# Patient Record
Sex: Female | Born: 1960 | Race: Asian | Hispanic: No | Marital: Married | Smoking: Never smoker
Health system: Southern US, Community
[De-identification: ages and names within clinical notes are randomized; demographics above are authoritative.]

## PROBLEM LIST (undated history)

## (undated) DIAGNOSIS — I10 Essential (primary) hypertension: Secondary | ICD-10-CM

## (undated) DIAGNOSIS — F419 Anxiety disorder, unspecified: Secondary | ICD-10-CM

## (undated) DIAGNOSIS — H7290 Unspecified perforation of tympanic membrane, unspecified ear: Secondary | ICD-10-CM

## (undated) HISTORY — DX: Unspecified perforation of tympanic membrane, unspecified ear: H72.90

## (undated) HISTORY — DX: Anxiety disorder, unspecified: F41.9

## (undated) HISTORY — DX: Essential (primary) hypertension: I10

---

## 2016-11-17 HISTORY — PX: TYMPANOPLASTY: SHX33

## 2017-01-21 ENCOUNTER — Ambulatory Visit (INDEPENDENT_AMBULATORY_CARE_PROVIDER_SITE_OTHER): Payer: BLUE CROSS/BLUE SHIELD | Admitting: Obstetrics and Gynecology

## 2017-01-21 ENCOUNTER — Encounter: Payer: Self-pay | Admitting: Obstetrics and Gynecology

## 2017-01-21 VITALS — BP 132/80 | HR 84 | Resp 14 | Ht 62.5 in | Wt 114.0 lb

## 2017-01-21 DIAGNOSIS — Z0000 Encounter for general adult medical examination without abnormal findings: Secondary | ICD-10-CM | POA: Diagnosis not present

## 2017-01-21 DIAGNOSIS — Z124 Encounter for screening for malignant neoplasm of cervix: Secondary | ICD-10-CM

## 2017-01-21 DIAGNOSIS — N952 Postmenopausal atrophic vaginitis: Secondary | ICD-10-CM

## 2017-01-21 DIAGNOSIS — N941 Unspecified dyspareunia: Secondary | ICD-10-CM

## 2017-01-21 DIAGNOSIS — Z01419 Encounter for gynecological examination (general) (routine) without abnormal findings: Secondary | ICD-10-CM | POA: Diagnosis not present

## 2017-01-21 LAB — LIPID PANEL
Cholesterol: 227 mg/dL — ABNORMAL HIGH (ref <200)
HDL: 72 mg/dL (ref >50)
LDL Cholesterol: 129 mg/dL — ABNORMAL HIGH (ref <100)
Total CHOL/HDL Ratio: 3.2 Ratio (ref <5.0)
Triglycerides: 132 mg/dL (ref <150)
VLDL: 26 mg/dL (ref <30)

## 2017-01-21 LAB — COMPREHENSIVE METABOLIC PANEL
ALBUMIN: 4.8 g/dL (ref 3.6–5.1)
ALK PHOS: 66 U/L (ref 33–130)
ALT: 8 U/L (ref 6–29)
AST: 18 U/L (ref 10–35)
BILIRUBIN TOTAL: 0.8 mg/dL (ref 0.2–1.2)
BUN: 19 mg/dL (ref 7–25)
CALCIUM: 9.8 mg/dL (ref 8.6–10.4)
CO2: 27 mmol/L (ref 20–31)
Chloride: 102 mmol/L (ref 98–110)
Creat: 0.75 mg/dL (ref 0.50–1.05)
Glucose, Bld: 104 mg/dL — ABNORMAL HIGH (ref 65–99)
POTASSIUM: 5.2 mmol/L (ref 3.5–5.3)
Sodium: 141 mmol/L (ref 135–146)
Total Protein: 8.1 g/dL (ref 6.1–8.1)

## 2017-01-21 LAB — CBC
HEMATOCRIT: 42.7 % (ref 35.0–45.0)
HEMOGLOBIN: 14.1 g/dL (ref 11.7–15.5)
MCH: 29.8 pg (ref 27.0–33.0)
MCHC: 33 g/dL (ref 32.0–36.0)
MCV: 90.3 fL (ref 80.0–100.0)
MPV: 9.1 fL (ref 7.5–12.5)
Platelets: 278 10*3/uL (ref 140–400)
RBC: 4.73 MIL/uL (ref 3.80–5.10)
RDW: 13.1 % (ref 11.0–15.0)
WBC: 5.4 10*3/uL (ref 3.8–10.8)

## 2017-01-21 MED ORDER — ESTRADIOL 10 MCG VA TABS: ORAL_TABLET | VAGINAL | 0 refills | Status: DC

## 2017-01-21 NOTE — Patient Instructions (Signed)

## 2017-01-21 NOTE — Progress Notes (Addendum)
56 y.o. [id number] MarriedAsianF here for annual exam.   She gets intermittently getting irritated in between her buttocks. She uses steroid cream as needed and it improves. A month ago she noticed some bumps just outside of her vagina. No pain, no irritation. No vaginal bleeding. Not sexually active secondary to pain. Too uncomfortable for even a finger.  Her husband has had a MI and is on medication for that and depression. He has had trouble having intercourse as well. She would like to improve things. She has tried lubricant in the past without help.  No vasomotor symptoms.     No LMP recorded. Patient is postmenopausal.          Sexually active: No.  The current method of family planning is post menopausal status.    Exercising: No.  The patient does not participate in regular exercise at present. Smoker:  no  Health Maintenance: Pap:  2014 WNL per patient  History of abnormal Pap:  no MMG:  2012 WNL per patient  Colonoscopy:  Never BMD:   2013, normal. TDaP:  2013 Gardasil: N/A   reports that she has never smoked. She has never used smokeless tobacco. She reports that she drinks about 8.4 oz of alcohol per week . She reports that she does not use drugs.She just quit her job. She and her husband moved here from [place] last year. Error above, only 1-2 drinks a week   Past Medical History:  Diagnosis Date  . Anxiety   . Hypertension   Thinks her HTN was due to stress with her husbands MI.   No past surgical history on file.  Current Outpatient Prescriptions  Medication Sig Dispense Refill  . aspirin EC 81 MG tablet Take 81 mg by mouth daily.    . cholecalciferol (VITAMIN D) 1000 units tablet Take 1,000 Units by mouth daily.    . Cyanocobalamin (VITAMIN B 12 PO) Take 1,000 mcg by mouth.    . Omega 3 1200 MG CAPS Take by mouth.    . vitamin C (ASCORBIC ACID) 500 MG tablet Take 500 mg by mouth daily.     No current facility-administered medications for this visit.     Family  History  Problem Relation Age of Onset  . Stroke Father   . Hypertension Father   . Alzheimer's disease Mother     Review of Systems  Constitutional: Negative.   HENT: Negative.   Eyes: Negative.   Respiratory: Negative.   Cardiovascular: Negative.   Gastrointestinal: Negative.   Endocrine: Negative.   Genitourinary: Positive for dyspareunia.       Vaginal rash  Musculoskeletal: Negative.   Skin: Negative.   Allergic/Immunologic: Negative.   Neurological: Negative.   Psychiatric/Behavioral: Negative.     Exam:   BP 132/80 (BP Location: Right Arm, Patient Position: Sitting, Cuff Size: Normal)   Pulse 84   Resp 14   Ht 5' 2.5" (1.588 m)   Wt 114 lb (51.7 kg)   BMI 20.52 kg/m   Weight change: @WEIGHTCHANGE @ Height:   Height: 5' 2.5" (158.8 cm)  Ht Readings from Last 3 Encounters:  01/21/17 5' 2.5" (1.588 m)    General appearance: alert, cooperative and appears stated age Head: Normocephalic, without obvious abnormality, atraumatic Neck: no adenopathy, supple, symmetrical, trachea midline and thyroid normal to inspection and palpation Lungs: clear to auscultation bilaterally Cardiovascular: regular rate and rhythm Breasts: normal appearance, no masses or tenderness Heart: regular rate and rhythm Abdomen: soft, non-tender; bowel sounds normal; no  masses,  no organomegaly Extremities: extremities normal, atraumatic, no cyanosis or edema Skin: Skin color, texture, turgor normal. No rashes or lesions Lymph nodes: Cervical, supraclavicular, and axillary nodes normal. No abnormal inguinal nodes palpated Neurologic: Grossly normal   Pelvic: External genitalia:  no lesions, few small sebaceous cysts              Urethra:  normal appearing urethra with no masses, tenderness or lesions              Bartholins and Skenes: normal                 Vagina: very atrophic vagina, tight with insertion of one finger vaginally              Cervix: no lesions               Bimanual  Exam:  Uterus:  normal size, contour, position, consistency, mobility, non-tender              Adnexa: no mass, fullness, tenderness               Rectovaginal: Confirms               Anus:  normal sphincter tone, no lesions  Chaperone was present for exam.  A:  Well Woman with normal exam  Vaginal atrophy  Dyspareunia  P:   Pap with hpv  Colonoscopy, will set up  Mammogram  Screening labs  Discussed breast self exam  Discussed calcium and vit D intake  Start vaginal estrogen  F/u in 1 month, don't have intercourse prior to f/u appointment

## 2017-01-22 LAB — VITAMIN D 25 HYDROXY (VIT D DEFICIENCY, FRACTURES): Vit D, 25-Hydroxy: 38 ng/mL (ref 30–100)

## 2017-01-23 LAB — IPS PAP TEST WITH HPV

## 2017-02-18 ENCOUNTER — Ambulatory Visit (INDEPENDENT_AMBULATORY_CARE_PROVIDER_SITE_OTHER): Payer: BLUE CROSS/BLUE SHIELD | Admitting: Obstetrics and Gynecology

## 2017-02-18 ENCOUNTER — Encounter: Payer: Self-pay | Admitting: Obstetrics and Gynecology

## 2017-02-18 VITALS — BP 140/82 | HR 76 | Resp 14 | Wt 115.0 lb

## 2017-02-18 DIAGNOSIS — N952 Postmenopausal atrophic vaginitis: Secondary | ICD-10-CM

## 2017-02-18 DIAGNOSIS — N941 Unspecified dyspareunia: Secondary | ICD-10-CM | POA: Diagnosis not present

## 2017-02-18 DIAGNOSIS — N907 Vulvar cyst: Secondary | ICD-10-CM

## 2017-02-18 MED ORDER — ESTRADIOL 10 MCG VA TABS: ORAL_TABLET | VAGINAL | 3 refills | Status: DC

## 2017-02-18 NOTE — Progress Notes (Signed)
GYNECOLOGY  VISIT   HPI: 56 y.o.   Married  Asian  female   [id number] with No LMP recorded. Patient is postmenopausal.   here for follow up vaginal atrophy. The patient was started on vaginal estrogen last month for severe atrophy and inability to be sexually active. She forgot it this weekend. Her vagina feels more comfortable. Hasn't attempted sexual activity yet.      GYNECOLOGIC HISTORY: No LMP recorded. Patient is postmenopausal. Contraception:postmenopause  Menopausal hormone therapy: Estradiol         OB History    Gravida Para Term Preterm AB Living   2       2     SAB TAB Ectopic Multiple Live Births   2                 There are no active problems to display for this patient.   Past Medical History:  Diagnosis Date  . Anxiety   . Hypertension     No past surgical history on file.  Current Outpatient Prescriptions  Medication Sig Dispense Refill  . aspirin EC 81 MG tablet Take 81 mg by mouth daily.    . cholecalciferol (VITAMIN D) 1000 units tablet Take 1,000 Units by mouth daily.    . Cyanocobalamin (VITAMIN B 12 PO) Take 1,000 mcg by mouth.    . Estradiol (VAGIFEM) 10 MCG TABS vaginal tablet 1 tablet vaginally qhs x 1 week, then 1 tablet vaginally 2 x a week at bedtime 24 tablet 0  . Omega 3 1200 MG CAPS Take by mouth.    . vitamin C (ASCORBIC ACID) 500 MG tablet Take 500 mg by mouth daily.     No current facility-administered medications for this visit.      ALLERGIES: Patient has no known allergies.  Family History  Problem Relation Age of Onset  . Stroke Father   . Hypertension Father   . Alzheimer's disease Mother     Social History   Social History  . Marital status: Married    Spouse name: N/A  . Number of children: N/A  . Years of education: N/A   Occupational History  . Not on file.   Social History Main Topics  . Smoking status: Never Smoker  . Smokeless tobacco: Never Used  . Alcohol use 0.6 - 1.2 oz/week    1 - 2 Standard drinks  or equivalent per week  . Drug use: No  . Sexual activity: Not Currently    Partners: Male    Birth control/ protection: Post-menopausal   Other Topics Concern  . Not on file   Social History Narrative  . No narrative on file    Review of Systems  Constitutional: Negative.   HENT: Negative.   Gastrointestinal: Negative.   Genitourinary:       "white spots in vaginal area"   Musculoskeletal: Negative.   Skin: Negative.   Neurological: Negative.   Endo/Heme/Allergies: Negative.   Psychiatric/Behavioral: Negative.     PHYSICAL EXAMINATION:    BP 140/82 (BP Location: Right Arm, Patient Position: Sitting, Cuff Size: Normal)   Pulse 76   Resp 14   Wt 115 lb (52.2 kg)   BMI 20.70 kg/m     General appearance: alert, cooperative and appears stated age Pelvic: External genitalia:  no lesions, she has a few dermal lesions filled with sebaceous material up to 7 mm in size, not tender, no erythema  Urethra:  normal appearing urethra with no masses, tenderness or lesions              Bartholins and Skenes: normal                 Vagina:atrophic appearing vagina with normal color and discharge, no lesions. Able to insert the pediatric speculum and one finger vaginally without pain. Able to minimally insert 2 fingers              Cervix: no lesions              Chaperone was present for exam.  ASSESSMENT Vaginal atrophy, improving Dyspareunia, hasn't attempted intercourse yet (still unable to place 2 fingers vaginally) Benign vulvar cysts, patient reassured not concerning     PLAN Continue vagifem 2 x a week Given information about vaginal dilators F/U prn or for her annual next year   An After Visit Summary was printed and given to the patient.

## 2018-05-12 ENCOUNTER — Encounter (HOSPITAL_BASED_OUTPATIENT_CLINIC_OR_DEPARTMENT_OTHER): Payer: Self-pay | Admitting: *Deleted

## 2018-05-12 ENCOUNTER — Emergency Department (HOSPITAL_BASED_OUTPATIENT_CLINIC_OR_DEPARTMENT_OTHER): Payer: Self-pay

## 2018-05-12 ENCOUNTER — Other Ambulatory Visit: Payer: Self-pay

## 2018-05-12 ENCOUNTER — Emergency Department (HOSPITAL_BASED_OUTPATIENT_CLINIC_OR_DEPARTMENT_OTHER)
Admission: EM | Admit: 2018-05-12 | Discharge: 2018-05-12 | Disposition: A | Discharge status: Home / Self Care | Payer: Self-pay | Attending: Emergency Medicine | Admitting: Emergency Medicine

## 2018-05-12 DIAGNOSIS — R079 Chest pain, unspecified: Secondary | ICD-10-CM | POA: Insufficient documentation

## 2018-05-12 DIAGNOSIS — M549 Dorsalgia, unspecified: Secondary | ICD-10-CM | POA: Insufficient documentation

## 2018-05-12 DIAGNOSIS — Z7982 Long term (current) use of aspirin: Secondary | ICD-10-CM | POA: Insufficient documentation

## 2018-05-12 DIAGNOSIS — Z79899 Other long term (current) drug therapy: Secondary | ICD-10-CM | POA: Insufficient documentation

## 2018-05-12 DIAGNOSIS — R42 Dizziness and giddiness: Secondary | ICD-10-CM | POA: Insufficient documentation

## 2018-05-12 DIAGNOSIS — I10 Essential (primary) hypertension: Secondary | ICD-10-CM | POA: Insufficient documentation

## 2018-05-12 DIAGNOSIS — R61 Generalized hyperhidrosis: Secondary | ICD-10-CM | POA: Insufficient documentation

## 2018-05-12 DIAGNOSIS — R1013 Epigastric pain: Secondary | ICD-10-CM | POA: Insufficient documentation

## 2018-05-12 LAB — CBC
HCT: 38.7 % (ref 36.0–46.0)
HEMOGLOBIN: 13.4 g/dL (ref 12.0–15.0)
MCH: 30.2 pg (ref 26.0–34.0)
MCHC: 34.6 g/dL (ref 30.0–36.0)
MCV: 87.4 fL (ref 78.0–100.0)
PLATELETS: 246 10*3/uL (ref 150–400)
RBC: 4.43 MIL/uL (ref 3.87–5.11)
RDW: 12 % (ref 11.5–15.5)
WBC: 7 10*3/uL (ref 4.0–10.5)

## 2018-05-12 LAB — BASIC METABOLIC PANEL
Anion gap: 6 (ref 5–15)
BUN: 14 mg/dL (ref 6–20)
CALCIUM: 8.8 mg/dL — AB (ref 8.9–10.3)
CHLORIDE: 103 mmol/L (ref 98–111)
CO2: 28 mmol/L (ref 22–32)
CREATININE: 0.63 mg/dL (ref 0.44–1.00)
GFR calc non Af Amer: >60 mL/min (ref >60)
Glucose, Bld: 138 mg/dL — ABNORMAL HIGH (ref 70–99)
Potassium: 3.3 mmol/L — ABNORMAL LOW (ref 3.5–5.1)
SODIUM: 137 mmol/L (ref 135–145)

## 2018-05-12 LAB — HEPATIC FUNCTION PANEL
ALBUMIN: 4.4 g/dL (ref 3.5–5.0)
ALT: 16 U/L (ref 0–44)
AST: 35 U/L (ref 15–41)
Alkaline Phosphatase: 71 U/L (ref 38–126)
Bilirubin, Direct: 0.1 mg/dL (ref 0.0–0.2)
Indirect Bilirubin: 0.5 mg/dL (ref 0.3–0.9)
TOTAL PROTEIN: 7.7 g/dL (ref 6.5–8.1)
Total Bilirubin: 0.6 mg/dL (ref 0.3–1.2)

## 2018-05-12 LAB — TROPONIN I

## 2018-05-12 LAB — LIPASE, BLOOD: Lipase: 36 U/L (ref 11–51)

## 2018-05-12 MED ORDER — IOPAMIDOL (ISOVUE-370) INJECTION 76%
100.0000 mL | Freq: Once | INTRAVENOUS | Status: AC | PRN
  Administered 2018-05-12: 100 mL via INTRAVENOUS

## 2018-05-12 MED ORDER — LISINOPRIL 2.5 MG PO TABS: 2.5000 mg | ORAL_TABLET | Freq: Every day | ORAL | 0 refills | Status: DC

## 2018-05-12 NOTE — ED Notes (Signed)
Returned from CT.

## 2018-05-12 NOTE — ED Triage Notes (Signed)
Pt c/o  Severe upper back pain and abd pain with sudden onset of dizziness and blurred vision. Brought in by EMS

## 2018-05-12 NOTE — ED Provider Notes (Signed)
[facility] EMERGENCY DEPARTMENT Provider Note   CSN: [id number] Arrival date & time: 05/12/18  1606     History   Chief Complaint Chief Complaint  Patient presents with  . Near Syncope    HPI Misty Abbott is a 57 y.o. female who presents with chest pain/back pain. PMH significant for HTN, GERD, anxiety. She states that she was at work and sitting at her dest (she works as an Airline pilotaccountant) when she felt some dull mid back pain. She did some stretches and walked to the lunch room and took and aspirin and drank some water. She then had an acute onset of severe epigastric pain radiating to the back and lightheadedness with blurry vision. She went to her boss who had her lie down on the floor because she felt like she was going to pass out. EMS was called and checked an EKG and CBG which was normal. The pain then resolved completely however she decided to come to the ED to be checked out. She has a long standing hx of untreated HTN because she doesn't like to take medication. She currently denies any symptoms and feels better. She reports hx of syncopal episodes but has never had pain with them. She has also had somewhat similar pain in the past with heartburn.  HPI  Past Medical History:  Diagnosis Date  . Anxiety   . Hypertension     There are no active problems to display for this patient.   History reviewed. No pertinent surgical history.   OB History    Gravida  2   Para      Term      Preterm      AB  2   Living        SAB  2   TAB      Ectopic      Multiple      Live Births               Home Medications    Prior to Admission medications   Medication Sig Start Date End Date Taking? Authorizing Provider  aspirin EC 81 MG tablet Take 81 mg by mouth daily.    [provider]  cholecalciferol (VITAMIN D) 1000 units tablet Take 1,000 Units by mouth daily.    [provider]  Cyanocobalamin (VITAMIN B 12 PO) Take 1,000 mcg by  mouth.    [provider]  Estradiol (VAGIFEM) 10 MCG TABS vaginal tablet 1 tablet vaginally 2 x a week at bedtime 02/18/17   Romualdo BolkJertson, Jill Evelyn, MD  Omega 3 1200 MG CAPS Take by mouth.    [provider]  vitamin C (ASCORBIC ACID) 500 MG tablet Take 500 mg by mouth daily.    [provider]    Family History Family History  Problem Relation Age of Onset  . Stroke Father   . Hypertension Father   . Alzheimer's disease Mother     Social History Social History   Tobacco Use  . Smoking status: Never Smoker  . Smokeless tobacco: Never Used  Substance Use Topics  . Alcohol use: Yes    Alcohol/week: 0.6 - 1.2 oz    Types: 1 - 2 Standard drinks or equivalent per week  . Drug use: No     Allergies   Patient has no known allergies.   Review of Systems Review of Systems  Constitutional: Positive for diaphoresis. Negative for fever.  Eyes: Positive for visual disturbance.  Respiratory:  Negative for shortness of breath.   Cardiovascular: Positive for chest pain. Negative for leg swelling.  Gastrointestinal: Positive for abdominal pain. Negative for nausea and vomiting.  Musculoskeletal: Positive for back pain.  Neurological: Positive for light-headedness. Negative for syncope.  All other systems reviewed and are negative.    Physical Exam Updated Vital Signs BP (!) 191/93   Pulse 66   Temp 97.7 F (36.5 C) (Oral)   Resp 16   Ht 5\' 4"  (1.626 m)   Wt 50.3 kg (111 lb)   SpO2 100%   BMI 19.05 kg/m   Physical Exam  Constitutional: She is oriented to person, place, and time. She appears well-developed and well-nourished. No distress.  HENT:  Head: Normocephalic and atraumatic.  Eyes: Pupils are equal, round, and reactive to light. Conjunctivae are normal. Right eye exhibits no discharge. Left eye exhibits no discharge. No scleral icterus.  Neck: Normal range of motion.  Cardiovascular: Normal rate and regular rhythm.  Pulmonary/Chest: Effort  normal and breath sounds normal. No respiratory distress.  Abdominal: Soft. Bowel sounds are normal. She exhibits no distension. There is no tenderness.  Neurological: She is alert and oriented to person, place, and time.  Skin: Skin is warm and dry.  Psychiatric: She has a normal mood and affect. Her behavior is normal.  Nursing note and vitals reviewed.    ED Treatments / Results  Labs (all labs ordered are listed, but only abnormal results are displayed) Labs Reviewed  BASIC METABOLIC PANEL - Abnormal; Notable for the following components:      Result Value   Potassium 3.3 (*)    Glucose, Bld 138 (*)    Calcium 8.8 (*)    All other components within normal limits  CBC  TROPONIN I  LIPASE, BLOOD  HEPATIC FUNCTION PANEL    EKG EKG Interpretation  Date/Time:  Wednesday May 12 2018 16:20:49 EDT Ventricular Rate:  64 PR Interval:  162 QRS Duration: 86 QT Interval:  398 QTC Calculation: 410 R Axis:   71 Text Interpretation:  Normal sinus rhythm Normal ECG No old tracing to compare Confirmed by Melene Plan [phone]) on 05/12/2018 4:32:03 PM   Radiology Ct Angio Chest/abd/pel For Dissection W And/or W/wo  Result Date: 05/12/2018 CLINICAL DATA:  Chest and back pain, sudden onset earlier this afternoon. Near syncope. EXAM: CT ANGIOGRAPHY CHEST, ABDOMEN AND PELVIS TECHNIQUE: Multidetector CT imaging through the chest, abdomen and pelvis was performed using the standard protocol during bolus administration of intravenous contrast. Multiplanar reconstructed images and MIPs were obtained and reviewed to evaluate the vascular anatomy. CONTRAST:  ISOVUE-370 IOPAMIDOL (ISOVUE-370) INJECTION 76% COMPARISON:  None. FINDINGS: CTA CHEST FINDINGS Cardiovascular: Normal heart size. No significant pericardial effusion/thickening. No acute intramural hematoma, dissection, aneurysm, pseudoaneurysm or penetrating atherosclerotic ulcer in the minimally atherosclerotic thoracic aorta. Normal  caliber pulmonary arteries. Aortic arch vessels are patent. No central pulmonary emboli. Mediastinum/Nodes: No discrete thyroid nodules. Unremarkable esophagus. No pathologically enlarged axillary, mediastinal or hilar lymph nodes. Lungs/Pleura: No pneumothorax. No pleural effusion. Medial right middle lobe 3 mm solid pulmonary nodule (series 7/image 37). No acute consolidative airspace disease, lung masses or additional significant pulmonary nodules. Musculoskeletal: No aggressive appearing focal osseous lesions. Mild thoracic spondylosis. Review of the MIP images confirms the above findings. CTA ABDOMEN AND PELVIS FINDINGS VASCULAR Aorta: Atherosclerotic. Normal caliber aorta without aneurysm, dissection, vasculitis or significant stenosis. Celiac: Patent without evidence of aneurysm, dissection, vasculitis or significant stenosis. SMA: Patent without evidence of aneurysm, dissection, vasculitis or significant  stenosis. Renals: Single renal arteries bilaterally. Both renal arteries are patent without evidence of aneurysm, dissection, vasculitis, fibromuscular dysplasia or significant stenosis. IMA: Proximal occlusion with immediate reconstitution. Inflow: Patent without evidence of aneurysm, dissection, vasculitis or significant stenosis. Veins: No obvious venous abnormality within the limitations of this arterial phase study. Review of the MIP images confirms the above findings. NON-VASCULAR Hepatobiliary: Normal liver with no liver mass. Cholelithiasis. Mild gallbladder wall thickening. No pericholecystic fluid. No gallbladder distention. No biliary ductal dilatation. Pancreas: Normal, with no mass or duct dilation. Spleen: Normal size. No mass. Adrenals/Urinary Tract: Normal adrenals. Normal kidneys with no hydronephrosis and no renal mass. Normal bladder. Stomach/Bowel: Normal non-distended stomach. Normal caliber small bowel with no small bowel wall thickening. Normal appendix. Normal large bowel with no  diverticulosis, large bowel wall thickening or pericolonic fat stranding. Vascular/Lymphatic: Atherosclerotic nonaneurysmal abdominal aorta. No pathologically enlarged lymph nodes in the abdomen or pelvis. Reproductive: Coarse left upper uterine calcifications probably due to small degenerated fibroids. No adnexal masses. Other: No pneumoperitoneum, ascites or focal fluid collection. Musculoskeletal: No aggressive appearing focal osseous lesions. Mild lumbar spondylosis. Review of the MIP images confirms the above findings. IMPRESSION: 1. No acute aortic syndrome. 2. Aortic Atherosclerosis (ICD10-I70.0). 3. Cholelithiasis. Nonspecific mild gallbladder wall thickening, more likely due to chronic cholecystitis given the relatively contracted state of the gallbladder. If there is clinical concern for acute cholecystitis, right upper quadrant abdominal sonogram correlation may be obtained. 4. Solitary 3 mm right middle lobe solid pulmonary nodule. No follow-up needed if patient is low-risk. Non-contrast chest CT can be considered in 12 months if patient is high-risk. This recommendation follows the consensus statement: Guidelines for Management of Incidental Pulmonary Nodules Detected on CT Images:From the Fleischner Society 2017; published online before print (10.1148/radiol.[id number]). Electronically Signed   By: Delbert Phenix M.D.   On: 05/12/2018 19:39    Procedures Procedures (including critical care time)  Medications Ordered in ED Medications  iopamidol (ISOVUE-370) 76 % injection 100 mL (100 mLs Intravenous Contrast Given 05/12/18 1844)     Initial Impression / Assessment and Plan / ED Course  I have reviewed the triage vital signs and the nursing notes.  Pertinent labs & imaging results that were available during my care of the patient were reviewed by me and considered in my medical decision making (see chart for details).  57 year old female presents with acute onset of chest pain/back  pain/epigastric pain starting today. She is asymptomatic on my exam. She is persistently hypertensive in the ED. Otherwise vitals are normal. EKG is NSR. CBC is normal. CMP is remarkable for mild hypokalemia and hypocalcemia. Lipase is normal. Troponin is normal. CTA of chest/abdomen/pelvis was ordered to r/o dissection based on patient's concerning symptoms. It is negative for dissection but shows gallstones and a pulmonary nodule.   On recheck the patient still feels fine. Results were discussed with the patient and her husband. They are requesting rx for Lisinopril which she's taken in the past. Will refill this for her today. She was also given results from today. Return precautions were given.  Final Clinical Impressions(s) / ED Diagnoses   Final diagnoses:  Chest pain, unspecified type  Hypertension, unspecified type    ED Discharge Orders    None       Bethel Born, PA-C 05/12/18 2104    Melene Plan, DO 05/12/18 2155

## 2018-05-12 NOTE — ED Notes (Signed)
[facility] report-pt at work approx 3pm-sudden onets mid back/CP, anxious, near syncope-lasted 10 min-felt "100% normal" when EMS arrived-12 lead normal-VVS except hypertensive-pt with hx noncompliant

## 2018-05-12 NOTE — ED Notes (Signed)
Patient transported to CT 

## 2018-05-12 NOTE — Discharge Instructions (Signed)
Take Lisinopril once daily. Please follow up with your doctor for refills

## 2018-05-12 NOTE — ED Notes (Signed)
Pt had an episode today where she had mid back pain that radiated through to her epigastric region with nausea and SOB.  She currently denies any symptoms.  She has been off of her BP medication since last year.

## 2018-05-12 NOTE — ED Notes (Signed)
ED Provider at bedside. 

## 2019-06-22 ENCOUNTER — Encounter: Payer: Self-pay | Admitting: Family

## 2019-06-22 ENCOUNTER — Ambulatory Visit (INDEPENDENT_AMBULATORY_CARE_PROVIDER_SITE_OTHER): Payer: Managed Care, Other (non HMO) | Admitting: Family

## 2019-06-22 ENCOUNTER — Other Ambulatory Visit: Payer: Self-pay

## 2019-06-22 VITALS — BP 163/96

## 2019-06-22 DIAGNOSIS — I10 Essential (primary) hypertension: Secondary | ICD-10-CM

## 2019-06-22 DIAGNOSIS — F419 Anxiety disorder, unspecified: Secondary | ICD-10-CM

## 2019-06-22 DIAGNOSIS — R002 Palpitations: Secondary | ICD-10-CM

## 2019-06-22 MED ORDER — LISINOPRIL 10 MG PO TABS: 10.0000 mg | ORAL_TABLET | Freq: Every day | ORAL | 0 refills | Status: DC

## 2019-06-22 NOTE — Progress Notes (Signed)
Virtual Visit via Video Note  I connected with Misty Abbott on 06/22/19 at 11:00 AM EDT by a video enabled telemedicine application and verified that I am speaking with the correct person using two identifiers.  Location: Patient: home Provider: home   I discussed the limitations of evaluation and management by telemedicine and the availability of in person appointments. The patient expressed understanding and agreed to proceed.  History of Present Illness:  HTN-  Reports that she continues lisinopril 2.5mg .      Anxiety- reports that this started when her husband had a heart attack. Reports that she manages ok, she has never been medicated for anxiety.   Gen: Reports that she has gained 4 pounds since  Resp: denies cough/cold symptoms CV: Denies sob.  Reports that she has had some palpitations at night with anxiety- last episode >1 week ago.  Abd: Denies nausea/vomitting diarrhea GU: denies dysuria/frequency/blood MS: denies myalgia/arthralgia. Neuro: denies HA Psych: denies depression Skin: denies rashes  Past Medical History:  Diagnosis Date  . Anxiety   . Hypertension   . Perforated tympanic membrane      Social History   Socioeconomic History  . Marital status: Married    Spouse name: Not on file  . Number of children: Not on file  . Years of education: Not on file  . Highest education level: Not on file  Occupational History  . Not on file  Social Needs  . Financial resource strain: Not on file  . Food insecurity    Worry: Not on file    Inability: Not on file  . Transportation needs    Medical: Not on file    Non-medical: Not on file  Tobacco Use  . Smoking status: Never Smoker  . Smokeless tobacco: Never Used  Substance and Sexual Activity  . Alcohol use: Yes    Alcohol/week: 1.0 - 2.0 standard drinks    Types: 1 - 2 Standard drinks or equivalent per week  . Drug use: No  . Sexual activity: Not Currently    Partners: Male    Birth  control/protection: Post-menopausal  Lifestyle  . Physical activity    Days per week: Not on file    Minutes per session: Not on file  . Stress: Not on file  Relationships  . Social Musicianconnections    Talks on phone: Not on file    Gets together: Not on file    Attends religious service: Not on file    Active member of club or organization: Not on file    Attends meetings of clubs or organizations: Not on file    Relationship status: Not on file  . Intimate partner violence    Fear of current or ex partner: Not on file    Emotionally abused: Not on file    Physically abused: Not on file    Forced sexual activity: Not on file  Other Topics Concern  . Not on file  Social History Narrative   Works in Education officer, environmentalfinance, Air cabin crewaccounting assistant   No children,    Enjoys walks, dog, reading, outdoor/activities    Past Surgical History:  Procedure Laterality Date  . TYMPANOPLASTY  2018   Sees Dr. [facility]    Family History  Problem Relation Age of Onset  . Stroke Father   . Hypertension Father   . Alzheimer's disease Mother        died at age 57    No Known Allergies  Current Outpatient Medications on File  Prior to Visit  Medication Sig Dispense Refill  . aspirin EC 81 MG tablet Take 81 mg by mouth daily.    . cholecalciferol (VITAMIN D) 1000 units tablet Take 1,000 Units by mouth daily.    . Cyanocobalamin (VITAMIN B 12 PO) Take 1,000 mcg by mouth.    . Estradiol (VAGIFEM) 10 MCG TABS vaginal tablet 1 tablet vaginally 2 x a week at bedtime 24 tablet 3  . Omega 3 1200 MG CAPS Take by mouth.    . vitamin C (ASCORBIC ACID) 500 MG tablet Take 500 mg by mouth daily.     No current facility-administered medications on file prior to visit.     BP (!) 163/96      Observations/Objective:   Gen: Awake, alert, no acute distress Resp: Breathing is even and non-labored Psych: calm/pleasant demeanor Neuro: Alert and Oriented x 3, + facial symmetry, speech is clear.   Assessment  and Plan:  HTN- bp is elevated. Increase lisinopril from 2.5mg  to 10mg  once daily. Discussed low sodium diet.  Anxiety- stable without medication.  Monitor.  Palpitations- plan to check EKG, electrolytes, TSH at her upcoming cpx.   Follow Up Instructions:    I discussed the assessment and treatment plan with the patient. The patient was provided an opportunity to ask questions and all were answered. The patient agreed with the plan and demonstrated an understanding of the instructions.   The patient was advised to call back or seek an in-person evaluation if the symptoms worsen or if the condition fails to improve as anticipated.  Nance Pear, NP

## 2020-01-05 IMAGING — CT CT ANGIO CHEST-ABD-PELV FOR DISSECTION W/ AND WO/W CM
2 of 9 series · 13 of 46 positions shown, 15 images · IV contrast (APPLIED)
Comparison: None.

CLINICAL DATA: Chest and back pain, sudden onset earlier this
afternoon. Near syncope.

EXAM:
CT ANGIOGRAPHY CHEST, ABDOMEN AND PELVIS
TECHNIQUE: Multidetector CT imaging through the chest, abdomen and pelvis was
performed using the standard protocol during bolus administration of
intravenous contrast. Multiplanar reconstructed images and MIPs were
obtained and reviewed to evaluate the vascular anatomy.
CONTRAST:  100mL KQBALU-501 IOPAMIDOL (KQBALU-501) INJECTION 76%

[Series 5: axial arterial · axial · arterial · 0.65mm/px · z∈[-691,-130]mm · 10 of 211 slices shown, 12 images]
[im 12/211  soft-tissue]
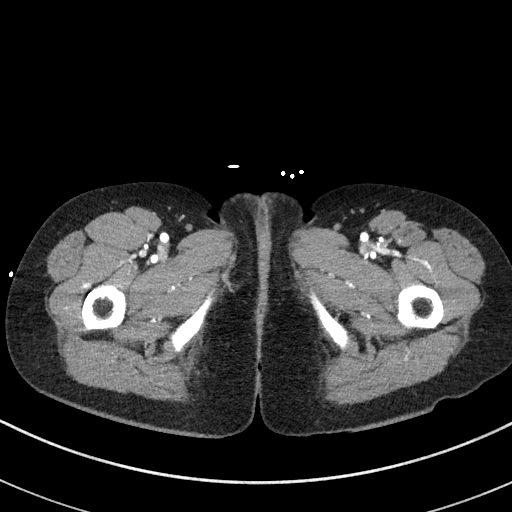
[im 12/211  bone]
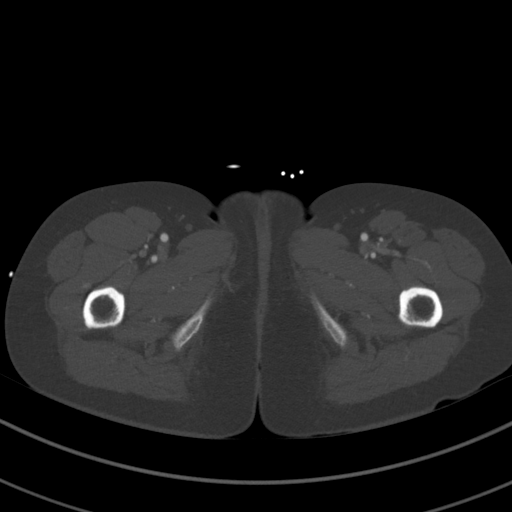
[im 36/211  soft-tissue]
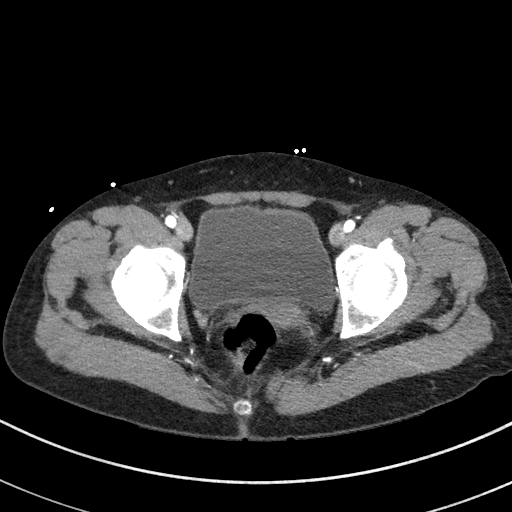
[im 59/211  soft-tissue]
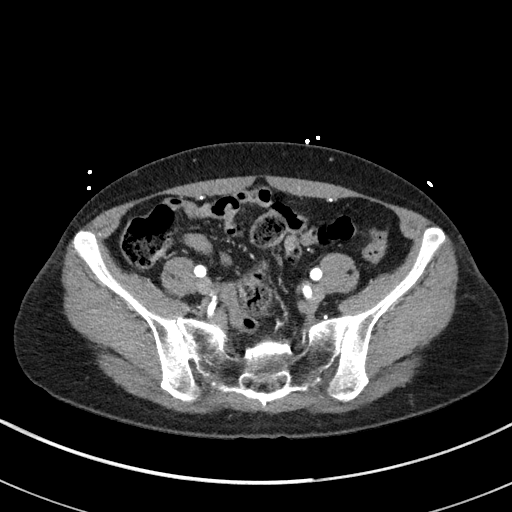
[im 71/211  soft-tissue]
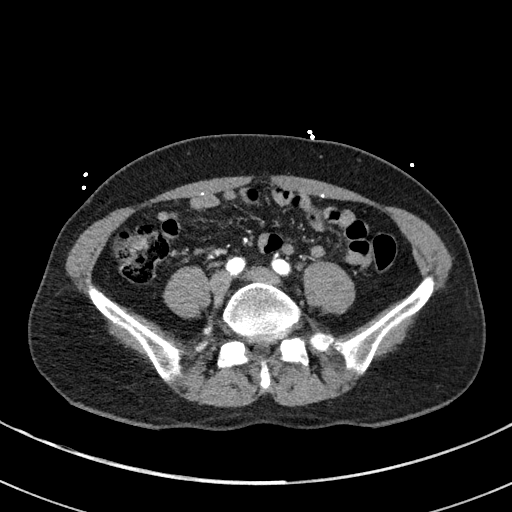
[im 94/211  soft-tissue]
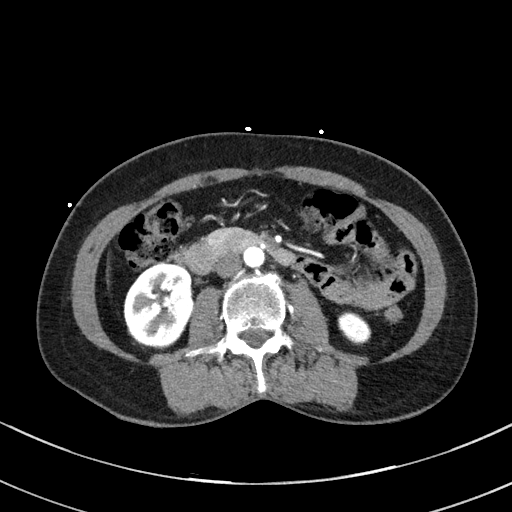
[im 117/211  soft-tissue]
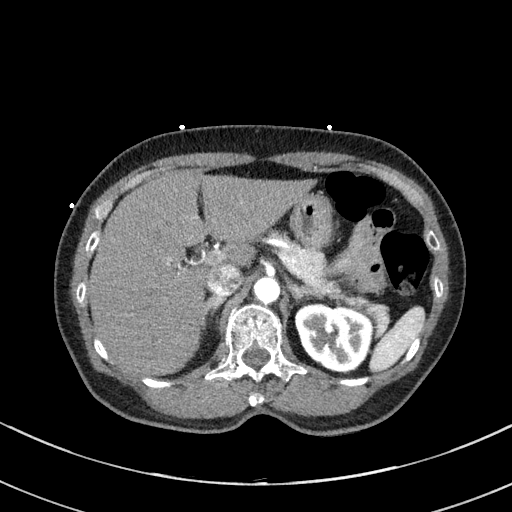
[im 141/211  soft-tissue]
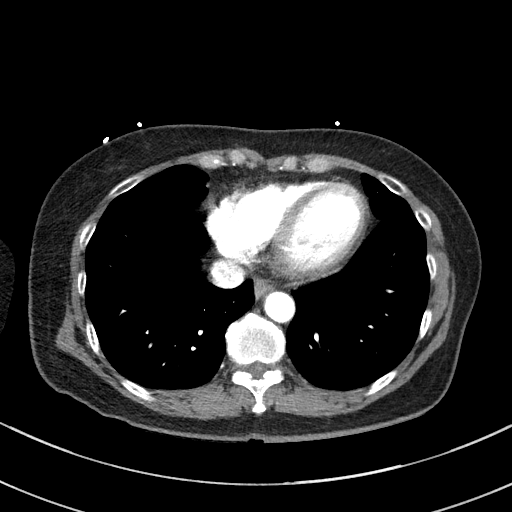
[im 152/211  soft-tissue]
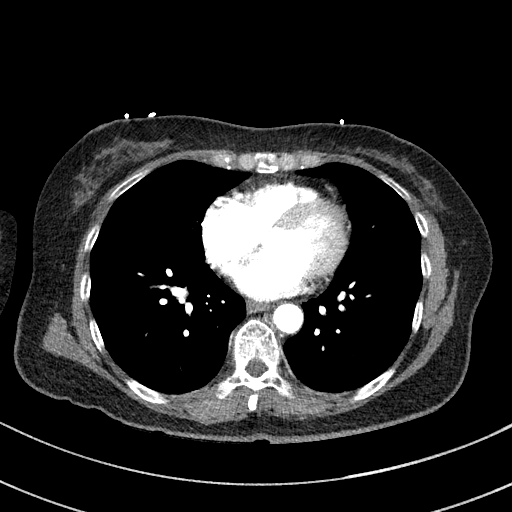
[im 176/211  soft-tissue]
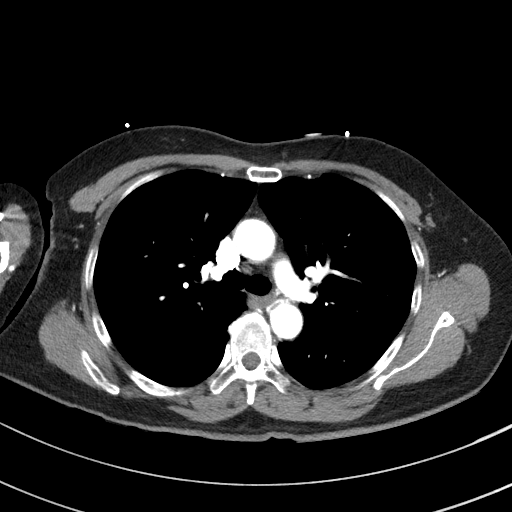
[im 176/211  bone]
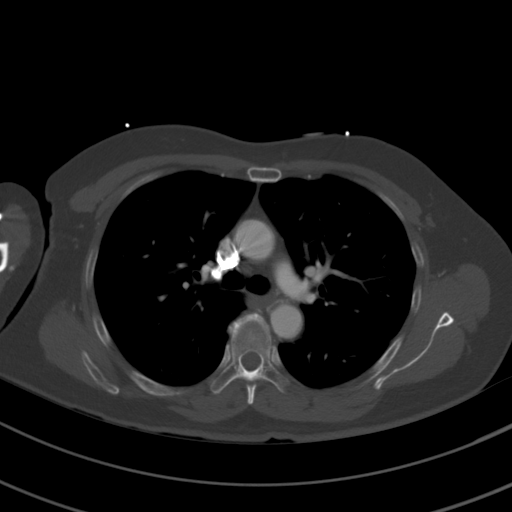
[im 199/211  soft-tissue]
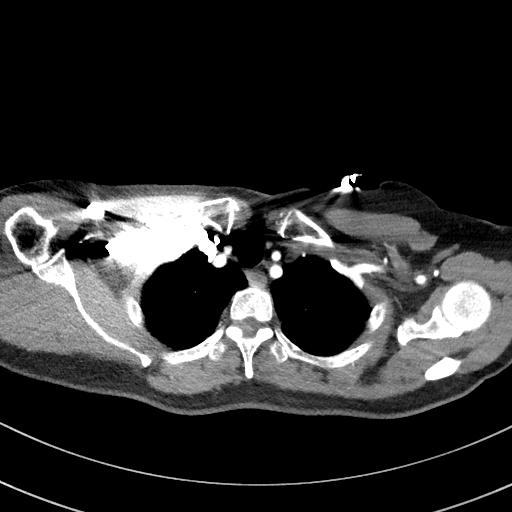

[Series 8: coronals · coronal · 0.73mm/px · 3 of 118 slices shown]
[im 30/118  soft-tissue]
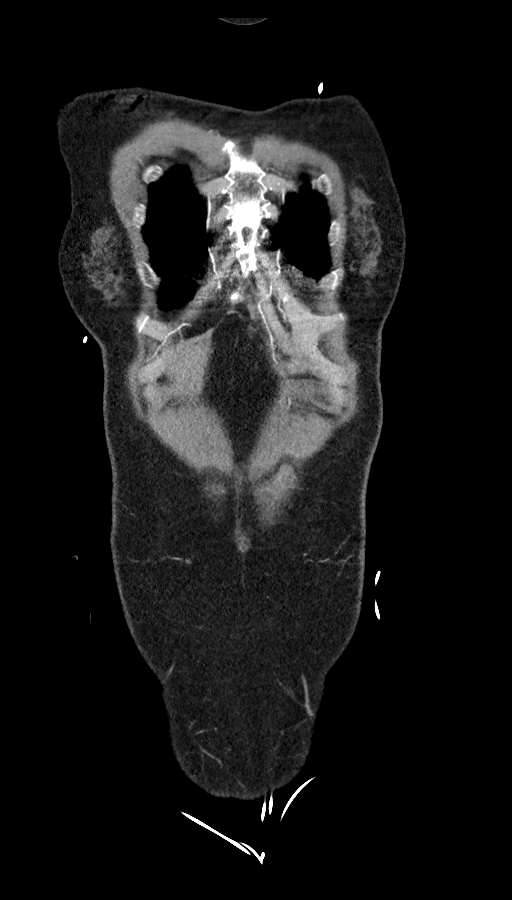
[im 59/118  soft-tissue]
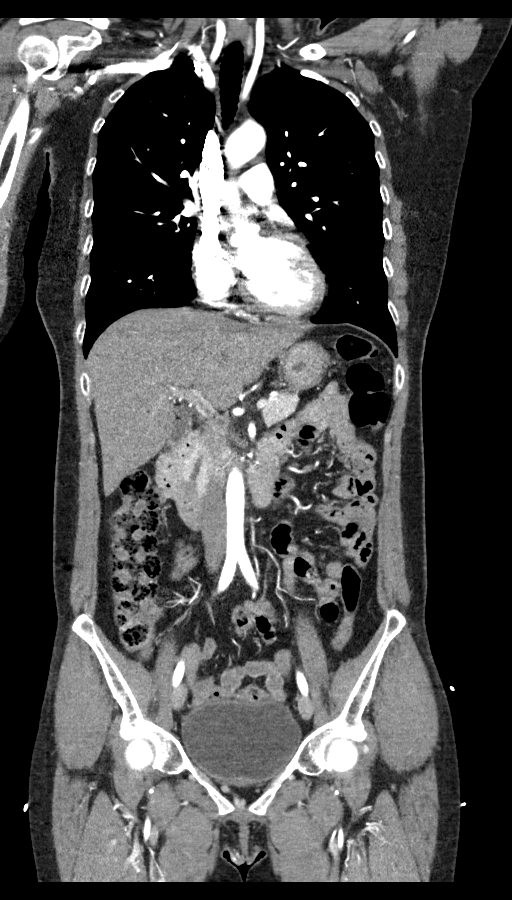
[im 88/118  soft-tissue]
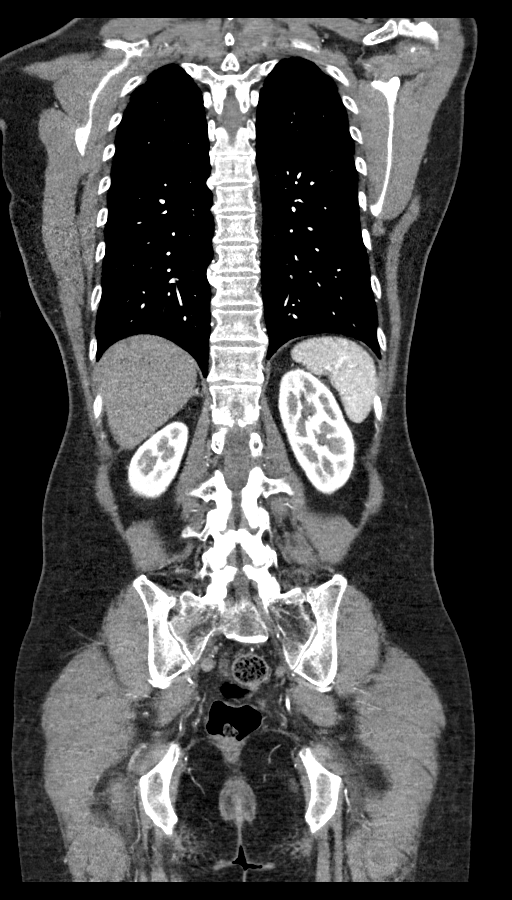

[13 of 46 positions shown; findings below may reference images not displayed]

FINDINGS: CTA CHEST FINDINGS

Cardiovascular: Normal heart size. No significant pericardial
effusion/thickening. No acute intramural hematoma, dissection,
aneurysm, pseudoaneurysm or penetrating atherosclerotic ulcer in the
minimally atherosclerotic thoracic aorta. Normal caliber pulmonary
arteries. Aortic arch vessels are patent. No central pulmonary
emboli.

Mediastinum/Nodes: No discrete thyroid nodules. Unremarkable
esophagus. No pathologically enlarged axillary, mediastinal or hilar
lymph nodes.

Lungs/Pleura: No pneumothorax. No pleural effusion. Medial right
middle lobe 3 mm solid pulmonary nodule (series 7/image 37). No
acute consolidative airspace disease, lung masses or additional
significant pulmonary nodules.

Musculoskeletal: No aggressive appearing focal osseous lesions. Mild
thoracic spondylosis.

Review of the MIP images confirms the above findings.

CTA ABDOMEN AND PELVIS FINDINGS

VASCULAR

Aorta: Atherosclerotic. Normal caliber aorta without aneurysm,
dissection, vasculitis or significant stenosis.

Celiac: Patent without evidence of aneurysm, dissection, vasculitis
or significant stenosis.

SMA: Patent without evidence of aneurysm, dissection, vasculitis or
significant stenosis.

Renals: Single renal arteries bilaterally. Both renal arteries are
patent without evidence of aneurysm, dissection, vasculitis,
fibromuscular dysplasia or significant stenosis.

IMA: Proximal occlusion with immediate reconstitution.

Inflow: Patent without evidence of aneurysm, dissection, vasculitis
or significant stenosis.

Veins: No obvious venous abnormality within the limitations of this
arterial phase study.

Review of the MIP images confirms the above findings.

NON-VASCULAR

Hepatobiliary: Normal liver with no liver mass. Cholelithiasis. Mild
gallbladder wall thickening. No pericholecystic fluid. No
gallbladder distention. No biliary ductal dilatation.

Pancreas: Normal, with no mass or duct dilation.

Spleen: Normal size. No mass.

Adrenals/Urinary Tract: Normal adrenals. Normal kidneys with no
hydronephrosis and no renal mass. Normal bladder.

Stomach/Bowel: Normal non-distended stomach. Normal caliber small
bowel with no small bowel wall thickening. Normal appendix. Normal
large bowel with no diverticulosis, large bowel wall thickening or
pericolonic fat stranding.

Vascular/Lymphatic: Atherosclerotic nonaneurysmal abdominal aorta.
No pathologically enlarged lymph nodes in the abdomen or pelvis.

Reproductive: Coarse left upper uterine calcifications probably due
to small degenerated fibroids. No adnexal masses.

Other: No pneumoperitoneum, ascites or focal fluid collection.

Musculoskeletal: No aggressive appearing focal osseous lesions. Mild
lumbar spondylosis.

Review of the MIP images confirms the above findings.
IMPRESSION: 1. No acute aortic syndrome.
2. Aortic Atherosclerosis ([id number]-TUR.R).
3. Cholelithiasis. Nonspecific mild gallbladder wall thickening,
more likely due to chronic cholecystitis given the relatively
contracted state of the gallbladder. If there is clinical concern
for acute cholecystitis, right upper quadrant abdominal sonogram
correlation may be obtained.
4. Solitary 3 mm right middle lobe solid pulmonary nodule. No
follow-up needed if patient is low-risk. Non-contrast chest CT can
be considered in 12 months if patient is high-risk. This
recommendation follows the consensus statement: Guidelines for
Management of Incidental Pulmonary Nodules Detected on CT
Images:From the [HOSPITAL] 2055; published online before
print (10.1148/radiol.[id number]).

## 2020-07-25 ENCOUNTER — Ambulatory Visit (INDEPENDENT_AMBULATORY_CARE_PROVIDER_SITE_OTHER): Payer: Managed Care, Other (non HMO) | Admitting: Family

## 2020-07-25 ENCOUNTER — Other Ambulatory Visit: Payer: Self-pay

## 2020-07-25 VITALS — BP 185/82 | HR 65 | Temp 97.8°F | Resp 16 | Wt 115.0 lb

## 2020-07-25 DIAGNOSIS — F419 Anxiety disorder, unspecified: Secondary | ICD-10-CM | POA: Diagnosis not present

## 2020-07-25 DIAGNOSIS — R002 Palpitations: Secondary | ICD-10-CM

## 2020-07-25 DIAGNOSIS — I10 Essential (primary) hypertension: Secondary | ICD-10-CM | POA: Diagnosis not present

## 2020-07-25 MED ORDER — LISINOPRIL 5 MG PO TABS: 5.0000 mg | ORAL_TABLET | Freq: Every day | ORAL | 3 refills | Status: DC

## 2020-07-25 MED ORDER — HYDROXYZINE HCL 25 MG PO TABS: 25.0000 mg | ORAL_TABLET | Freq: Three times a day (TID) | ORAL | 2 refills | Status: DC | PRN

## 2020-07-25 MED ORDER — AMLODIPINE BESYLATE 5 MG PO TABS: 5.0000 mg | ORAL_TABLET | Freq: Every day | ORAL | 3 refills | Status: DC

## 2020-07-25 NOTE — Progress Notes (Signed)
Subjective:    Patient ID: Misty Abbott, female    DOB: 09/12/1961, 59 y.o.   MRN: [id number]  HPI   Patient is a 59 yr old female who presents today for follow up.   HTN- last visit we increased lisinopril from 2.5 to 10mg  once daily.  She reports that she is afraid to increase dose to 10mg . She has only been taking 5mg  only.     She complains of anxiety.  Reports that when she turns off the tv she feels heart pounding.    She reports that she has been having panic feelings at night.  Her dog has been sick.    165/90 bp at home, 154/95  BP Readings from Last 3 Encounters:  07/25/20 (!) 185/82  06/22/19 (!) 163/96  05/12/18 (!) 162/76   Review of Systems See HPI  Past Medical History:  Diagnosis Date  . Anxiety   . Hypertension   . Perforated tympanic membrane      Social History   Socioeconomic History  . Marital status: Married    Spouse name: Not on file  . Number of children: Not on file  . Years of education: Not on file  . Highest education level: Not on file  Occupational History  . Not on file  Tobacco Use  . Smoking status: Never Smoker  . Smokeless tobacco: Never Used  Substance and Sexual Activity  . Alcohol use: Yes    Alcohol/week: 1.0 - 2.0 standard drink    Types: 1 - 2 Standard drinks or equivalent per week  . Drug use: No  . Sexual activity: Not Currently    Partners: Male    Birth control/protection: Post-menopausal  Other Topics Concern  . Not on file  Social History Narrative   Works in 09/24/20, 08/22/19   No children,    Enjoys walks, dog, reading, outdoor/activities   Social Determinants of 05/14/18 Strain:   . Difficulty of Paying Living Expenses: Not on file  Food Insecurity:   . Worried About Education officer, environmental in the Last Year: Not on file  . Ran Out of Food in the Last Year: Not on file  Transportation Needs:   . Lack of Transportation (Medical): Not on file  . Lack of Transportation  (Non-Medical): Not on file  Physical Activity:   . Days of Exercise per Week: Not on file  . Minutes of Exercise per Session: Not on file  Stress:   . Feeling of Stress : Not on file  Social Connections:   . Frequency of Communication with Friends and Family: Not on file  . Frequency of Social Gatherings with Friends and Family: Not on file  . Attends Religious Services: Not on file  . Active Member of Clubs or Organizations: Not on file  . Attends Air cabin crew Meetings: Not on file  . Marital Status: Not on file  Intimate Partner Violence:   . Fear of Current or Ex-Partner: Not on file  . Emotionally Abused: Not on file  . Physically Abused: Not on file  . Sexually Abused: Not on file    Past Surgical History:  Procedure Laterality Date  . TYMPANOPLASTY  2018   Sees Dr. Programme researcher, [facility] [facility]    Family History  Problem Relation Age of Onset  . Stroke Father   . Hypertension Father   . Alzheimer's disease Mother        died at age 45    No  Known Allergies  Current Outpatient Medications on File Prior to Visit  Medication Sig Dispense Refill  . aspirin EC 81 MG tablet Take 81 mg by mouth daily.    . cholecalciferol (VITAMIN D) 1000 units tablet Take 1,000 Units by mouth daily.    . Cyanocobalamin (VITAMIN B 12 PO) Take 1,000 mcg by mouth.    . Estradiol (VAGIFEM) 10 MCG TABS vaginal tablet 1 tablet vaginally 2 x a week at bedtime 24 tablet 3  . Omega 3 1200 MG CAPS Take by mouth.    . vitamin C (ASCORBIC ACID) 500 MG tablet Take 500 mg by mouth daily.     No current facility-administered medications on file prior to visit.    BP (!) 185/82 (BP Location: Right Arm, Patient Position: Sitting, Cuff Size: Small)   Pulse 65   Temp 97.8 F (36.6 C) (Temporal)   Resp 16   Wt 115 lb (52.2 kg)   SpO2 100%   BMI 19.74 kg/m       Objective:   Physical Exam Constitutional:      Appearance: She is well-developed.  Neck:     Thyroid: No thyromegaly.    Cardiovascular:     Rate and Rhythm: Normal rate and regular rhythm.     Heart sounds: Normal heart sounds. No murmur heard.   Pulmonary:     Effort: Pulmonary effort is normal. No respiratory distress.     Breath sounds: Normal breath sounds. No wheezing.  Musculoskeletal:     Cervical back: Neck supple.  Skin:    General: Skin is warm and dry.  Neurological:     Mental Status: She is alert and oriented to person, place, and time.  Psychiatric:        Mood and Affect: Mood is anxious.        Behavior: Behavior normal.        Thought Content: Thought content normal.        Judgment: Judgment normal.           Assessment & Plan:  HTN- bp uncontrolled on lisinopril 5mg . Will add amlodipine 5mg  once daily.  Palpitations- tracing is personally reviewed.  EKG notes NSR.  No acute changes. Suspect anxiety is a contributing factor. Check labs as ordered.  Anxiety- discussed counseling, medication rx.  She prefers only to take medication on an as needed basis. Will give trial of prn atarax.   This visit occurred during the SARS-CoV-2 public health emergency.  Safety protocols were in place, including screening questions prior to the visit, additional usage of staff PPE, and extensive cleaning of exam room while observing appropriate contact time as indicated for disinfecting solutions.

## 2020-07-25 NOTE — Patient Instructions (Addendum)
Please begin amlodipine 5mg  once daily. Continue lisinopril 5mg  once daily.  Begin atarax as needed for anxiety.

## 2020-07-26 ENCOUNTER — Encounter: Payer: Self-pay | Admitting: Family

## 2020-07-26 LAB — COMPREHENSIVE METABOLIC PANEL
AG Ratio: 1.6 (calc) (ref 1.0–2.5)
ALT: 6 U/L (ref 6–29)
AST: 17 U/L (ref 10–35)
Albumin: 4.8 g/dL (ref 3.6–5.1)
Alkaline phosphatase (APISO): 66 U/L (ref 37–153)
BUN: 14 mg/dL (ref 7–25)
CO2: 31 mmol/L (ref 20–32)
Calcium: 10 mg/dL (ref 8.6–10.4)
Chloride: 100 mmol/L (ref 98–110)
Creat: 0.68 mg/dL (ref 0.50–1.05)
Globulin: 3 g/dL (calc) (ref 1.9–3.7)
Glucose, Bld: 98 mg/dL (ref 65–99)
Potassium: 4.9 mmol/L (ref 3.5–5.3)
Sodium: 138 mmol/L (ref 135–146)
Total Bilirubin: 0.7 mg/dL (ref 0.2–1.2)
Total Protein: 7.8 g/dL (ref 6.1–8.1)

## 2020-07-26 LAB — CBC WITH DIFFERENTIAL/PLATELET
Absolute Monocytes: 403 cells/uL (ref 200–950)
Basophils Absolute: 39 cells/uL (ref 0–200)
Basophils Relative: 0.7 %
Eosinophils Absolute: 123 cells/uL (ref 15–500)
Eosinophils Relative: 2.2 %
HCT: 40.5 % (ref 35.0–45.0)
Hemoglobin: 13.5 g/dL (ref 11.7–15.5)
Lymphs Abs: 1898 cells/uL (ref 850–3900)
MCH: 30.3 pg (ref 27.0–33.0)
MCHC: 33.3 g/dL (ref 32.0–36.0)
MCV: 90.8 fL (ref 80.0–100.0)
MPV: 9.8 fL (ref 7.5–12.5)
Monocytes Relative: 7.2 %
Neutro Abs: 3136 cells/uL (ref 1500–7800)
Neutrophils Relative %: 56 %
Platelets: 270 10*3/uL (ref 140–400)
RBC: 4.46 10*6/uL (ref 3.80–5.10)
RDW: 12 % (ref 11.0–15.0)
Total Lymphocyte: 33.9 %
WBC: 5.6 10*3/uL (ref 3.8–10.8)

## 2020-07-26 LAB — TSH: TSH: 2.45 mIU/L (ref 0.40–4.50)

## 2020-07-26 NOTE — Progress Notes (Signed)
Mailed out to pt 

## 2020-08-17 ENCOUNTER — Encounter: Payer: Managed Care, Other (non HMO) | Admitting: Family

## 2020-12-06 ENCOUNTER — Other Ambulatory Visit: Payer: Self-pay | Admitting: Family

## 2021-01-11 ENCOUNTER — Other Ambulatory Visit: Payer: Self-pay | Admitting: Family

## 2021-07-27 ENCOUNTER — Other Ambulatory Visit: Payer: Self-pay | Admitting: Family

## 2021-07-27 NOTE — Telephone Encounter (Signed)
We have not seen her in 1 year. She is past due for follow up. Needs OV before we can refill medications.

## 2021-07-29 NOTE — Telephone Encounter (Signed)
Called patient to let her know she needs a follow up appointment. She reports she doesn't need the refill at this time. I encouraged her to schedule a follow up since she is due and not to wait until she needs this refill. She wants to call back to set up the appointment. Windell Moulding CMA

## 2021-09-12 ENCOUNTER — Telehealth: Payer: Self-pay | Admitting: Family

## 2021-09-12 NOTE — Telephone Encounter (Signed)
Pt. Called in and wanted to see if her husband could establish care:  Misty Abbott: 04/23/1959

## 2022-01-28 ENCOUNTER — Emergency Department (HOSPITAL_BASED_OUTPATIENT_CLINIC_OR_DEPARTMENT_OTHER)
Admission: EM | Admit: 2022-01-28 | Discharge: 2022-01-28 | Disposition: A | Discharge status: Home / Self Care | Payer: 59 | Attending: Emergency Medicine | Admitting: Emergency Medicine

## 2022-01-28 ENCOUNTER — Other Ambulatory Visit: Payer: Self-pay

## 2022-01-28 ENCOUNTER — Encounter (HOSPITAL_BASED_OUTPATIENT_CLINIC_OR_DEPARTMENT_OTHER): Payer: Self-pay | Admitting: Urology

## 2022-01-28 DIAGNOSIS — I10 Essential (primary) hypertension: Secondary | ICD-10-CM | POA: Diagnosis present

## 2022-01-28 DIAGNOSIS — Z7982 Long term (current) use of aspirin: Secondary | ICD-10-CM | POA: Diagnosis not present

## 2022-01-28 DIAGNOSIS — Z79899 Other long term (current) drug therapy: Secondary | ICD-10-CM | POA: Insufficient documentation

## 2022-01-28 LAB — COMPREHENSIVE METABOLIC PANEL
ALT: 9 U/L (ref 0–44)
AST: 18 U/L (ref 15–41)
Albumin: 4.5 g/dL (ref 3.5–5.0)
Alkaline Phosphatase: 58 U/L (ref 38–126)
Anion gap: 8 (ref 5–15)
BUN: 17 mg/dL (ref 6–20)
CO2: 27 mmol/L (ref 22–32)
Calcium: 9.4 mg/dL (ref 8.9–10.3)
Chloride: 100 mmol/L (ref 98–111)
Creatinine, Ser: 0.68 mg/dL (ref 0.44–1.00)
GFR, Estimated: >60 mL/min (ref >60)
Glucose, Bld: 105 mg/dL — ABNORMAL HIGH (ref 70–99)
Potassium: 4.2 mmol/L (ref 3.5–5.1)
Sodium: 135 mmol/L (ref 135–145)
Total Bilirubin: 0.9 mg/dL (ref 0.3–1.2)
Total Protein: 8 g/dL (ref 6.5–8.1)

## 2022-01-28 NOTE — ED Provider Notes (Signed)
?[facility] ?Provider Note ? ? ?CSN: [id number] ?Arrival date & time: 01/28/22  1429 ? ?  ? ?History ? ?Chief Complaint  ?Patient presents with  ? Hypertension  ? ? ?Misty Abbott is a 61 y.o. female. ? ?Patient with history of hypertension presents today with concerns about high blood pressure.  She states that over the past few days her blood pressure has been reading consistently over [id number] systolic.  States that she has been under a lot of stress recently after moving into a new house, stressors related to job and recent death in the family.  She therefore suspect this is why her blood pressure has been higher.  She normally takes 5 mg of lisinopril daily for management of her blood pressure, she has been compliant with his medication and denies any missed doses.  She denies any other associated symptoms including headaches, dizziness, chest pain, shortness of breath, nausea, vomiting, diarrhea.  Of note, she does express some concern that her blood pressure cuff is inaccurate as it is more than 60 years old and immediately prior to arrival she checked it and it was over 200 and when she got here it was [id number] systolic.  Of note, she does have an appointment with her primary care doctor next Tuesday. ? ?The history is provided by the patient. No language interpreter was used.  ?Hypertension ?Pertinent negatives include no chest pain, no headaches and no shortness of breath.  ? ?  ? ?Home Medications ?Prior to Admission medications   ?Medication Sig Start Date End Date Taking? Authorizing Provider  ?amLODipine (NORVASC) 5 MG tablet TAKE 1 TABLET(5 MG) BY MOUTH DAILY 01/11/21   [name], NP  ?aspirin EC 81 MG tablet Take 81 mg by mouth daily.    [provider]  ?cholecalciferol (VITAMIN D) 1000 units tablet Take 1,000 Units by mouth daily.    [provider]  ?Cyanocobalamin (VITAMIN B 12 PO) Take 1,000 mcg by mouth.    [provider]  ?Estradiol  (VAGIFEM) 10 MCG TABS vaginal tablet 1 tablet vaginally 2 x a week at bedtime 02/18/17   Salvadore Dom, MD  ?hydrOXYzine (ATARAX/VISTARIL) 25 MG tablet Take 1 tablet (25 mg total) by mouth 3 (three) times daily as needed. 07/25/20   [name], NP  ?lisinopril (ZESTRIL) 5 MG tablet Take 1 tablet (5 mg total) by mouth daily. 07/25/20   [name], NP  ?Omega 3 1200 MG CAPS Take by mouth.    [provider]  ?vitamin C (ASCORBIC ACID) 500 MG tablet Take 500 mg by mouth daily.    [provider]  ?   ? ?Allergies    ?Patient has no known allergies.   ? ?Review of Systems   ?Review of Systems  ?Constitutional:  Negative for chills and fever.  ?Eyes:  Negative for visual disturbance.  ?Respiratory:  Negative for shortness of breath.   ?Cardiovascular:  Negative for chest pain.  ?Gastrointestinal:  Negative for nausea and vomiting.  ?Neurological:  Negative for headaches.  ?Psychiatric/Behavioral:  Negative for confusion and decreased concentration.   ?All other systems reviewed and are negative. ? ?Physical Exam ?Updated Vital Signs ?BP (!) 152/83   Pulse 60   Temp 98 ?F (36.7 ?C) (Oral)   Resp 16   Ht 5\' 4"  (1.626 m)   Wt 52.2 kg   SpO2 100%   BMI 19.75 kg/m?  ?Physical Exam ?Vitals and nursing note reviewed.  ?Constitutional:   ?  General: She is not in acute distress. ?   Appearance: Normal appearance. She is normal weight. She is not ill-appearing, toxic-appearing or diaphoretic.  ?HENT:  ?   Head: Normocephalic and atraumatic.  ?Eyes:  ?   Extraocular Movements: Extraocular movements intact.  ?   Pupils: Pupils are equal, round, and reactive to light.  ?Cardiovascular:  ?   Rate and Rhythm: Normal rate and regular rhythm.  ?   Heart sounds: Normal heart sounds.  ?Pulmonary:  ?   Effort: Pulmonary effort is normal. No respiratory distress.  ?   Breath sounds: Normal breath sounds.  ?Abdominal:  ?   General: Abdomen is flat.  ?   Palpations: Abdomen is soft.   ?Musculoskeletal:     ?   General: Normal range of motion.  ?   Cervical back: Normal range of motion.  ?Skin: ?   General: Skin is warm and dry.  ?Neurological:  ?   General: No focal deficit present.  ?   Mental Status: She is alert and oriented to person, place, and time.  ?Psychiatric:     ?   Mood and Affect: Mood normal.     ?   Behavior: Behavior normal.  ? ? ?ED Results / Procedures / Treatments   ?Labs ?(all labs ordered are listed, but only abnormal results are displayed) ?Labs Reviewed  ?COMPREHENSIVE METABOLIC PANEL - Abnormal; Notable for the following components:  ?    Result Value  ? Glucose, Bld 105 (*)   ? All other components within normal limits  ? ? ?EKG ?None ? ?Radiology ?No results found. ? ?Procedures ?Procedures  ? ? ?Medications Ordered in ED ?Medications - No data to display ? ?ED Course/ Medical Decision Making/ A&P ?  ?                        ?Medical Decision Making ?Amount and/or Complexity of Data Reviewed ?Labs: ordered. ? ? ?Patient with history of hypertension on lisinopril presents today with concerns related to her blood pressure.  She is currently afebrile, nontoxic-appearing, and in no acute distress with reassuring vital signs.  Her blood pressure throughout her 3-1/2-hour stay in the emergency department today has been 150s/80s. She has no complaints related to her blood pressure and is entirely asymptomatic at this time.  ? ?I, Lavonna Rua, PA-C, personally reviewed and evaluated these lab results supported by medical decision making ? ?BMP without acute laboratory findings. Low suspicion for hypertensive urgency or emergency at this time. I have discussed this with patient. Plan to have her get a new blood pressure cuff to ensure accurate measurements, educated her to check her pressures at the same time every day and to write her findings down and bring it to her PCP office at her appointment on Tuesday. Also advised that if her blood pressure is over 180/110 then she can  take an additional 5 mg of Lisinopril for management.  I have also emphasized the importance of low-salt diet and 30 minutes of exercise every day to help lower blood pressure.  Patient expresses understanding and is amenable with plan.  Educated on red flag symptoms that would prompt immediate return.  Discharged stable condition. ? ?Final Clinical Impression(s) / ED Diagnoses ?Final diagnoses:  ?Hypertension, unspecified type  ? ? ?Rx / DC Orders ?ED Discharge Orders   ? ? None  ? ?  ?An After Visit Summary was printed and given to the patient. ? ? ?  ?  Bud Face, PA-C ?01/28/22 1816 ? ?  ?Fredia Sorrow, MD ?01/31/22 1610 ? ?

## 2022-01-28 NOTE — ED Triage Notes (Signed)
Pt states high blood pressure x 1 week ?States systolic 208 at home  ?Denies headache  ?Reports lightheadedness  ?Takes lisinopril 5 mg, took today  ?Took 81 mg asa x 1 hr ago ?

## 2022-01-28 NOTE — Discharge Instructions (Addendum)
As we discussed, your work-up in the ER today was reassuring for acute abnormalities.  I recommend that you get a new blood pressure cuff to ensure accurate readings.  Please check your blood pressure at the same time every day and write it down and bring it to your PCP appointment next week.  In the interim, if you blood pressure reads over 180/110 you may take an additional 5 mg of lisinopril for management. ? ?Additionally, I recommend a low-salt diet with 30 minutes of moderate exercise every day to help lower your blood pressures.  Please remember to discuss your concerns at your PCP appointment next week. ? ?Return if development of any new or worsening symptoms. ?

## 2022-02-04 ENCOUNTER — Ambulatory Visit (INDEPENDENT_AMBULATORY_CARE_PROVIDER_SITE_OTHER): Payer: 59 | Admitting: Family

## 2022-02-04 ENCOUNTER — Encounter: Payer: Self-pay | Admitting: Family

## 2022-02-04 VITALS — BP 155/70 | HR 77 | Temp 98.2°F | Resp 16 | Ht 64.0 in | Wt 108.0 lb

## 2022-02-04 DIAGNOSIS — E785 Hyperlipidemia, unspecified: Secondary | ICD-10-CM | POA: Diagnosis not present

## 2022-02-04 DIAGNOSIS — I10 Essential (primary) hypertension: Secondary | ICD-10-CM

## 2022-02-04 DIAGNOSIS — Z0000 Encounter for general adult medical examination without abnormal findings: Secondary | ICD-10-CM | POA: Insufficient documentation

## 2022-02-04 LAB — COMPREHENSIVE METABOLIC PANEL
ALT: 7 U/L (ref 0–35)
AST: 17 U/L (ref 0–37)
Albumin: 5 g/dL (ref 3.5–5.2)
Alkaline Phosphatase: 68 U/L (ref 39–117)
BUN: 16 mg/dL (ref 6–23)
CO2: 28 mEq/L (ref 19–32)
Calcium: 9.8 mg/dL (ref 8.4–10.5)
Chloride: 100 mEq/L (ref 96–112)
Creatinine, Ser: 0.66 mg/dL (ref 0.40–1.20)
GFR: 95.65 mL/min (ref >60.00)
Glucose, Bld: 97 mg/dL (ref 70–99)
Potassium: 4.5 mEq/L (ref 3.5–5.1)
Sodium: 137 mEq/L (ref 135–145)
Total Bilirubin: 0.6 mg/dL (ref 0.2–1.2)
Total Protein: 7.8 g/dL (ref 6.0–8.3)

## 2022-02-04 LAB — LIPID PANEL
Cholesterol: 208 mg/dL — ABNORMAL HIGH (ref 0–200)
HDL: 74.5 mg/dL (ref >39.00)
LDL Cholesterol: 114 mg/dL — ABNORMAL HIGH (ref 0–99)
NonHDL: 133.28
Total CHOL/HDL Ratio: 3
Triglycerides: 96 mg/dL (ref 0.0–149.0)
VLDL: 19.2 mg/dL (ref 0.0–40.0)

## 2022-02-04 MED ORDER — AMLODIPINE BESYLATE 5 MG PO TABS: ORAL_TABLET | ORAL | 1 refills | Status: DC

## 2022-02-04 MED ORDER — LISINOPRIL 5 MG PO TABS: 5.0000 mg | ORAL_TABLET | Freq: Every day | ORAL | 1 refills | Status: DC

## 2022-02-04 NOTE — Assessment & Plan Note (Signed)
BP Readings from Last 3 Encounters:  ?02/04/22 (!) 155/70  ?01/28/22 134/84  ?07/25/20 (!) 185/82  ? ?Uncontrolled. Resume amlodipine, continue lisinopril  ?

## 2022-02-04 NOTE — Patient Instructions (Addendum)
Please schedule routine dental exam. ?Restart amlodipine.  ?

## 2022-02-04 NOTE — Assessment & Plan Note (Signed)
Continue healthy diet. Encouraged exercise.  Refer for mammogram. Declines covid booster, tetanus, shingrix and colonoscopy/cologuard (will consider colon cancer screening). She will schedule pap with GYN.  ?

## 2022-02-04 NOTE — Progress Notes (Signed)
? ?Subjective:  ? ?By signing my name below, I, Misty Abbott, attest that this documentation has been prepared under the direction and in the presence of [name] NP, 02/04/2022 ? ? Patient ID: Misty Abbott, female    DOB: 10-17-1961, 61 y.o.   MRN: [id number] ? ?Chief Complaint  ?Patient presents with  ? Annual Exam  ? ? ?HPI ?Patient is in today for a comprehensive physical exam. ? ?Blood Pressure - On 01/28/2022 she went to the ER for an elevated blood pressure. She noted symptoms of headaches. She has been taking her husbands 2.5 MG of Lisinopril. ?BP Readings from Last 3 Encounters:  ?02/04/22 (!) 155/70  ?01/28/22 134/84  ?07/25/20 (!) 185/82  ? ?Right Ear Pain - She complains of pain in her right ear.  ? ?Refill - She is requesting a refill of 5 MG of Amlodipine and 5 MG of Lisinopril. ? ?Depression - She has been sadden due to her sister's passing. She also has been dealing with external stressors.  ? ?She denies having any fever, new muscle pain, joint pain, new moles, congestion, sinus pain, sore throat, palpations, wheezing, n/v/d, constipation, blood in stool, dysuria, frequency, hematuria at this time. ? ? ?Colonoscopy: She is considering using a Cologuard or a colonoscopy.  ?Pap Smear: Last completed 01/21/2017. ?Immunizations: She reports that her last Tetanus vaccine was in 2013. She declines the offer for a Tetanus vaccine. She is considering receiving the Shingrix vaccine. She declines the offer for Hepatis C screening.  ?Diet / Exercise: She does not exercise regularly.  ?Dental: She reports her last dental exam was in 2019. ?Vision:  Her last vision exam was on 04/2021. ?Health Maintenance Due  ?Topic Date Due  ? TETANUS/TDAP  Never done  ? COLONOSCOPY (Pts 45-58yr Insurance coverage will need to be confirmed)  Never done  ? MAMMOGRAM  Never done  ? Zoster Vaccines- Shingrix (1 of 2) Never done  ? PAP SMEAR-Modifier  01/22/2020  ? ? ?Past Medical History:  ?Diagnosis Date  ? Anxiety    ? Hypertension   ? Perforated tympanic membrane   ? ? ?Past Surgical History:  ?Procedure Laterality Date  ? TYMPANOPLASTY  2018  ? Sees Dr. DSilvestre MesiENT  ? ? ?Family History  ?Problem Relation Age of Onset  ? Stroke Father   ? Hypertension Father   ? Alzheimer's disease Mother   ?     died at age 60 ? ? ?Social History  ? ?Socioeconomic History  ? Marital status: Married  ?  Spouse name: Not on file  ? Number of children: Not on file  ? Years of education: Not on file  ? Highest education level: Not on file  ?Occupational History  ? Not on file  ?Tobacco Use  ? Smoking status: Never  ? Smokeless tobacco: Never  ?Substance and Sexual Activity  ? Alcohol use: Yes  ?  Alcohol/week: 1.0 - 2.0 standard drink  ?  Types: 1 - 2 Standard drinks or equivalent per week  ? Drug use: No  ? Sexual activity: Not Currently  ?  Partners: Male  ?  Birth control/protection: Post-menopausal  ?Other Topics Concern  ? Not on file  ?Social History Narrative  ? Works in fEngineer, mining aDatabase administrator ? No children,   ? Enjoys walks, dog, reading, outdoor/activities  ? ?Social Determinants of Health  ? ?Financial Resource Strain: Not on file  ?Food Insecurity: Not on file  ?Transportation Needs: Not on file  ?  Physical Activity: Not on file  ?Stress: Not on file  ?Social Connections: Not on file  ?Intimate Partner Violence: Not on file  ? ? ?Outpatient Medications Prior to Visit  ?Medication Sig Dispense Refill  ? aspirin EC 81 MG tablet Take 81 mg by mouth daily.    ? cholecalciferol (VITAMIN D) 1000 units tablet Take 1,000 Units by mouth daily.    ? Cyanocobalamin (VITAMIN B 12 PO) Take 1,000 mcg by mouth.    ? Omega 3 1200 MG CAPS Take by mouth.    ? vitamin C (ASCORBIC ACID) 500 MG tablet Take 500 mg by mouth daily.    ? lisinopril (ZESTRIL) 5 MG tablet Take 1 tablet (5 mg total) by mouth daily. 900 tablet 3  ? amLODipine (NORVASC) 5 MG tablet TAKE 1 TABLET(5 MG) BY MOUTH DAILY (Patient not taking: Reported on 02/04/2022) 30 tablet  0  ? Estradiol (VAGIFEM) 10 MCG TABS vaginal tablet 1 tablet vaginally 2 x a week at bedtime 24 tablet 3  ? hydrOXYzine (ATARAX/VISTARIL) 25 MG tablet Take 1 tablet (25 mg total) by mouth 3 (three) times daily as needed. 30 tablet 2  ? ?No facility-administered medications prior to visit.  ? ? ?No Known Allergies ? ?Review of Systems  ?Constitutional:  Negative for fever.  ?HENT:  Positive for ear pain (Right). Negative for congestion, sinus pain and sore throat.   ?Respiratory:  Negative for wheezing.   ?Cardiovascular:  Negative for palpitations.  ?Gastrointestinal:  Negative for blood in stool, constipation, diarrhea, nausea and vomiting.  ?Genitourinary:  Negative for dysuria, frequency and hematuria.  ?Musculoskeletal:  Negative for joint pain and myalgias.  ?Skin:   ?     (-) New Moles  ?Neurological:  Positive for headaches.  ?Psychiatric/Behavioral:  Positive for depression. The patient is not nervous/anxious.   ? ?   ?Objective:  ?  ?Physical Exam ?Constitutional:   ?   General: She is not in acute distress. ?   Appearance: Normal appearance. She is not ill-appearing.  ?HENT:  ?   Head: Normocephalic and atraumatic.  ?   Right Ear: Tympanic membrane, ear canal and external ear normal.  ?   Left Ear: Tympanic membrane, ear canal and external ear normal.  ?Eyes:  ?   Extraocular Movements: Extraocular movements intact.  ?   Pupils: Pupils are equal, round, and reactive to light.  ?Cardiovascular:  ?   Rate and Rhythm: Normal rate and regular rhythm.  ?   Heart sounds: Normal heart sounds. No murmur heard. ?  No gallop.  ?Pulmonary:  ?   Effort: Pulmonary effort is normal. No respiratory distress.  ?   Breath sounds: Normal breath sounds. No wheezing or rales.  ?Abdominal:  ?   General: Bowel sounds are normal. There is no distension.  ?   Palpations: Abdomen is soft.  ?   Tenderness: There is no abdominal tenderness. There is no guarding.  ?Musculoskeletal:  ?   Comments: 5/5 strength in both upper and lower  extremities  ?  ?Skin: ?   General: Skin is warm and dry.  ?Neurological:  ?   Mental Status: She is alert and oriented to person, place, and time.  ?   Deep Tendon Reflexes:  ?   Reflex Scores: ?     Patellar reflexes are 2+ on the right side and 2+ on the left side. ?Psychiatric:     ?   Mood and Affect: Mood normal.     ?  Behavior: Behavior normal.     ?   Judgment: Judgment normal.  ? ? ?BP (!) 155/70 (BP Location: Right Arm, Patient Position: Sitting, Cuff Size: Small)   Pulse 77   Temp 98.2 ?F (36.8 ?C) (Oral)   Resp 16   Ht '5\' 4"'  (1.626 m)   Wt 108 lb (49 kg)   SpO2 100%   BMI 18.54 kg/m?  ?Wt Readings from Last 3 Encounters:  ?02/04/22 108 lb (49 kg)  ?01/28/22 115 lb 1.3 oz (52.2 kg)  ?07/25/20 115 lb (52.2 kg)  ? ? ?   ?Assessment & Plan:  ? ?Problem List Items Addressed This Visit   ? ?  ? Unprioritized  ? Preventative health care - Primary  ?  Continue healthy diet. Encouraged exercise.  Refer for mammogram. Declines covid booster, tetanus, shingrix and colonoscopy/cologuard (will consider colon cancer screening). She will schedule pap with GYN.  ?  ?  ? Relevant Orders  ? MM 3D SCREEN BREAST BILATERAL  ? Hypertension  ?  BP Readings from Last 3 Encounters:  ?02/04/22 (!) 155/70  ?01/28/22 134/84  ?07/25/20 (!) 185/82  ?Uncontrolled. Resume amlodipine, continue lisinopril  ?  ?  ? Relevant Medications  ? amLODipine (NORVASC) 5 MG tablet  ? lisinopril (ZESTRIL) 5 MG tablet  ? Other Relevant Orders  ? Comp Met (CMET)  ? ?Other Visit Diagnoses   ? ? Hyperlipidemia, unspecified hyperlipidemia type      ? Relevant Medications  ? amLODipine (NORVASC) 5 MG tablet  ? lisinopril (ZESTRIL) 5 MG tablet  ? Other Relevant Orders  ? Lipid panel  ? ?  ? ? ? ? ?Meds ordered this encounter  ?Medications  ? amLODipine (NORVASC) 5 MG tablet  ?  Sig: TAKE 1 TABLET(5 MG) BY MOUTH DAILY  ?  Dispense:  90 tablet  ?  Refill:  1  ?  Please advised that she will need an apt for further refills.  ?  Order Specific  Question:   Supervising Provider  ?  Answer:   Penni Homans A [[id number]]  ? lisinopril (ZESTRIL) 5 MG tablet  ?  Sig: Take 1 tablet (5 mg total) by mouth daily.  ?  Dispense:  90 tablet  ?  Refill:  1  ?  Order Speci

## 2022-02-05 ENCOUNTER — Encounter: Payer: Self-pay | Admitting: Family

## 2022-02-05 NOTE — Progress Notes (Signed)
Mailed out to pt 

## 2022-03-03 ENCOUNTER — Inpatient Hospital Stay (HOSPITAL_BASED_OUTPATIENT_CLINIC_OR_DEPARTMENT_OTHER): Admission: RE | Admit: 2022-03-03 | Payer: 59 | Source: Ambulatory Visit

## 2022-03-04 ENCOUNTER — Ambulatory Visit: Payer: 59 | Admitting: Family

## 2022-04-07 ENCOUNTER — Ambulatory Visit (HOSPITAL_BASED_OUTPATIENT_CLINIC_OR_DEPARTMENT_OTHER): Payer: 59

## 2022-04-11 ENCOUNTER — Ambulatory Visit: Payer: 59 | Admitting: Family

## 2022-08-19 ENCOUNTER — Other Ambulatory Visit: Payer: Self-pay | Admitting: Family

## 2022-08-19 ENCOUNTER — Other Ambulatory Visit: Payer: Self-pay

## 2022-08-19 MED ORDER — AMLODIPINE BESYLATE 5 MG PO TABS: ORAL_TABLET | ORAL | 1 refills | Status: DC

## 2023-03-24 ENCOUNTER — Other Ambulatory Visit: Payer: Self-pay | Admitting: Family

## 2023-03-26 NOTE — Progress Notes (Signed)
Subjective:   By signing my name below, I, Misty Abbott, attest that this documentation has been prepared under the direction and in the presence of Lemont Fillers, NP 03/27/23   Patient ID: Misty Abbott, female    DOB: 1961-10-29, 62 y.o.   MRN: [id number]  Chief Complaint  Patient presents with   Annual Exam    HPI Patient is in today for a comprehensive physical exam.   Hypertension: At initial check her blood pressure was elevated at 171/59. At recheck her blood pressure was 170/78. She monitors her blood pressure at home and states she tends to get very  anxious when she is getting her blood pressure checked. She took her last lisinopril last night. She stopped taking 5 mg amlodipine 2 weeks ago due to being unable to get a refill. She occasionally will take an 81 mg ASA. She reports swelling in her extremities (fingers), which she attributes to her amlodipine.  BP Readings from Last 3 Encounters:  03/27/23 (!) 170/78  02/04/22 (!) 155/70  01/28/22 134/84   Ear ringing: She reports occasional ear ringing. She is following up with Dr. Francisco Capuchin [facility]. She had a tympanoplasty in 2018 for a perforated TM.   Acute: She denies having any fever, new muscle pain, new joint pain, new moles, congestion, sinus pain, sore throat, chest pain, palpitations, cough, SOB, wheezing, n/v/d, constipation, blood in stool, dysuria, frequency, hematuria, or headaches at this time.   Immunizations: She is not UTD on her shingles vaccine. She is interested in receiving the Tdap vaccine today.   Last colonoscopy: Overdue.   Last mammogram: Overdue for mammogram. Her last mammogram was done in [place].   Last pap: 01/21/2017. Results were normal.   Diet/Exercise: She maintains a healthy diet. Previously she would stay active by walking, but recently has not been exercising often.   Vision: She is UTD on routine vision care. Her last visit was about a month ago.   Dental: She is not UTD on  routine dental care.   Apparently she was told that she had hepatitis when she donated blood.  Reports follow up testing was negative.   Past Medical History:  Diagnosis Date   Anxiety    Hypertension    Perforated tympanic membrane     Past Surgical History:  Procedure Laterality Date   TYMPANOPLASTY  2018   Sees Dr. Francisco Capuchin ENT    Family History  Problem Relation Age of Onset   Alzheimer's disease Mother        died at age 56   Stroke Father    Hypertension Father    Diabetes Mellitus II Nephew    Diabetes Mellitus I Nephew     Social History   Socioeconomic History   Marital status: Married    Spouse name: Not on file   Number of children: Not on file   Years of education: Not on file   Highest education level: Not on file  Occupational History   Not on file  Tobacco Use   Smoking status: Never   Smokeless tobacco: Never  Substance and Sexual Activity   Alcohol use: Yes    Alcohol/week: 1.0 - 2.0 standard drink of alcohol    Types: 1 - 2 Standard drinks or equivalent per week   Drug use: No   Sexual activity: Not Currently    Partners: Male    Birth control/protection: Post-menopausal  Other Topics Concern   Not on file  Social History Narrative  Works in Education officer, environmental, Air cabin crew   No children,    Enjoys walks, dog, reading, outdoor/activities   Social Determinants of Corporate investment banker Strain: Not on [employer] Insecurity: Not on file  Transportation Needs: Not on file  Physical Activity: Not on file  Stress: Not on file  Social Connections: Not on file  Intimate Partner Violence: Not on file    Outpatient Medications Prior to Visit  Medication Sig Dispense Refill   cholecalciferol (VITAMIN D) 1000 units tablet Take 1,000 Units by mouth daily.     Cyanocobalamin (VITAMIN B 12 PO) Take 1,000 mcg by mouth.     Omega 3 1200 MG CAPS Take by mouth.     vitamin C (ASCORBIC ACID) 500 MG tablet Take 500 mg by mouth daily.      amLODipine (NORVASC) 5 MG tablet TAKE 1 TABLET(5 MG) BY MOUTH DAILY 90 tablet 1   aspirin EC 81 MG tablet Take 81 mg by mouth daily.     lisinopril (ZESTRIL) 5 MG tablet TAKE 1 TABLET(5 MG) BY MOUTH DAILY 90 tablet 1   No facility-administered medications prior to visit.    No Known Allergies  ROS     Objective:    Physical Exam Constitutional:      General: She is not in acute distress.    Appearance: Normal appearance. She is not ill-appearing.  HENT:     Head: Normocephalic and atraumatic.     Right Ear: Tympanic membrane, ear canal and external ear normal.     Left Ear: Tympanic membrane, ear canal and external ear normal.  Eyes:     Extraocular Movements: Extraocular movements intact.     Right eye: No nystagmus.     Left eye: No nystagmus.     Pupils: Pupils are equal, round, and reactive to light.  Neck:     Thyroid: No thyroid tenderness.  Cardiovascular:     Rate and Rhythm: Normal rate and regular rhythm.     Heart sounds: Normal heart sounds. No murmur heard.    No gallop.  Pulmonary:     Effort: Pulmonary effort is normal. No respiratory distress.     Breath sounds: Normal breath sounds. No wheezing or rales.  Abdominal:     General: There is no distension.     Palpations: Abdomen is soft.     Tenderness: There is no abdominal tenderness. There is no guarding.  Musculoskeletal:     Comments: 5/5 strength in both upper and lower extremities  Lymphadenopathy:     Cervical: No cervical adenopathy.  Skin:    General: Skin is warm and dry.     Comments: Eczema patch left forehead  Neurological:     Mental Status: She is alert and oriented to person, place, and time.     Deep Tendon Reflexes:     Reflex Scores:      Patellar reflexes are 2+ on the right side and 2+ on the left side. Psychiatric:        Judgment: Judgment normal.     BP (!) 170/78   Pulse 70   Temp 98 F (36.7 C) (Oral)   Resp 16   Ht 5\' 3"  (1.6 m)   Wt 115 lb (52.2 kg)   SpO2 100%    BMI 20.37 kg/m  Wt Readings from Last 3 Encounters:  03/27/23 115 lb (52.2 kg)  02/04/22 108 lb (49 kg)  01/28/22 115 lb 1.3 oz (52.2 kg)  Assessment & Plan:  Preventative health care Assessment & Plan: Tdap today. Declines colo but agreeable to cologuard.  Understands that if cologuard is positive, then colonoscopy is very important. Refer for mammogram, GYN for pap.  Encouraged her to try to add back in some regular exercise such as walking. Continue healthy diet. Pt to schedule follow up dental visit.   Orders: -     Ambulatory referral to Obstetrics / Gynecology -     CBC with Differential/Platelet  Primary hypertension Assessment & Plan: Uncontrolled. Restart lisinopril and amlodipine.  Will watch for edema.   Orders: -     Lisinopril; TAKE 1 TABLET(5 MG) BY MOUTH DAILY  Dispense: 90 tablet; Refill: 1 -     amLODIPine Besylate; TAKE 1 TABLET(5 MG) BY MOUTH DAILY  Dispense: 90 tablet; Refill: 1 -     Comprehensive metabolic panel  Breast cancer screening by mammogram -     3D Screening Mammogram, Left and Right; Future  Screening for colon cancer -     Cologuard  Encounter for screening for HIV -     HIV Antibody (routine testing w rflx)  Encounter for hepatitis C screening test for low risk patient -     Hepatitis C antibody  Need for hepatitis B screening test -     Hepatitis B surface antigen  Hyperlipidemia, unspecified hyperlipidemia type -     Lipid panel  Eczema, unspecified type Assessment & Plan: New.  Rx sent for betamethasone.   Orders: -     Betamethasone Dipropionate; Apply topically 2 (two) times daily as needed.  Dispense: 30 g; Refill: 1     I,Rachel Rivera,acting as a scribe for Lemont Fillers, NP.,have documented all relevant documentation on the behalf of Lemont Fillers, NP,as directed by  Lemont Fillers, NP while in the presence of Lemont Fillers, NP.   I, Lemont Fillers, NP, personally preformed  the services described in this documentation.  All medical record entries made by the scribe were at my direction and in my presence.  I have reviewed the chart and discharge instructions (if applicable) and agree that the record reflects my personal performance and is accurate and complete. 03/27/23   Lemont Fillers, NP

## 2023-03-27 ENCOUNTER — Ambulatory Visit (INDEPENDENT_AMBULATORY_CARE_PROVIDER_SITE_OTHER): Payer: 59 | Admitting: Family

## 2023-03-27 ENCOUNTER — Encounter: Payer: Self-pay | Admitting: Family

## 2023-03-27 VITALS — BP 170/78 | HR 70 | Temp 98.0°F | Resp 16 | Ht 63.0 in | Wt 115.0 lb

## 2023-03-27 DIAGNOSIS — Z114 Encounter for screening for human immunodeficiency virus [HIV]: Secondary | ICD-10-CM

## 2023-03-27 DIAGNOSIS — Z23 Encounter for immunization: Secondary | ICD-10-CM | POA: Diagnosis not present

## 2023-03-27 DIAGNOSIS — I10 Essential (primary) hypertension: Secondary | ICD-10-CM | POA: Diagnosis not present

## 2023-03-27 DIAGNOSIS — Z0001 Encounter for general adult medical examination with abnormal findings: Secondary | ICD-10-CM

## 2023-03-27 DIAGNOSIS — Z1159 Encounter for screening for other viral diseases: Secondary | ICD-10-CM

## 2023-03-27 DIAGNOSIS — Z0000 Encounter for general adult medical examination without abnormal findings: Secondary | ICD-10-CM

## 2023-03-27 DIAGNOSIS — E785 Hyperlipidemia, unspecified: Secondary | ICD-10-CM

## 2023-03-27 DIAGNOSIS — L309 Dermatitis, unspecified: Secondary | ICD-10-CM | POA: Insufficient documentation

## 2023-03-27 DIAGNOSIS — Z1211 Encounter for screening for malignant neoplasm of colon: Secondary | ICD-10-CM

## 2023-03-27 DIAGNOSIS — Z1231 Encounter for screening mammogram for malignant neoplasm of breast: Secondary | ICD-10-CM

## 2023-03-27 LAB — CBC WITH DIFFERENTIAL/PLATELET
Eosinophils Absolute: 248 cells/uL (ref 15–500)
HCT: 40.5 % (ref 35.0–45.0)
Hemoglobin: 13.5 g/dL (ref 11.7–15.5)
Lymphs Abs: 2101 cells/uL (ref 850–3900)
MCHC: 33.3 g/dL (ref 32.0–36.0)
Monocytes Relative: 8.1 %
Neutro Abs: 2543 cells/uL (ref 1500–7800)
RDW: 12.2 % (ref 11.0–15.0)

## 2023-03-27 MED ORDER — LISINOPRIL 5 MG PO TABS: ORAL_TABLET | ORAL | 1 refills | Status: DC

## 2023-03-27 MED ORDER — BETAMETHASONE DIPROPIONATE 0.05 % EX CREA
TOPICAL_CREAM | Freq: Two times a day (BID) | CUTANEOUS | 1 refills | Status: AC | PRN
Start: 2023-03-27 — End: ?

## 2023-03-27 MED ORDER — AMLODIPINE BESYLATE 5 MG PO TABS: ORAL_TABLET | ORAL | 1 refills | Status: DC

## 2023-03-27 NOTE — Assessment & Plan Note (Addendum)
Tdap today. Declines colo but agreeable to cologuard.  Understands that if cologuard is positive, then colonoscopy is very important. Refer for mammogram, GYN for pap.  Encouraged her to try to add back in some regular exercise such as walking. Continue healthy diet. Pt to schedule follow up dental visit.

## 2023-03-27 NOTE — Assessment & Plan Note (Signed)
New. Rx sent for betamethasone.  

## 2023-03-27 NOTE — Addendum Note (Signed)
Addended by: Wilford Corner on: 03/27/2023 02:52 PM   Modules accepted: Orders

## 2023-03-27 NOTE — Assessment & Plan Note (Signed)
Uncontrolled. Restart lisinopril and amlodipine.  Will watch for edema.

## 2023-03-28 LAB — LIPID PANEL
Cholesterol: 231 mg/dL — ABNORMAL HIGH (ref <200)
HDL: 72 mg/dL (ref >=50)
LDL Cholesterol (Calc): 129 mg/dL (calc) — ABNORMAL HIGH
Non-HDL Cholesterol (Calc): 159 mg/dL (calc) — ABNORMAL HIGH (ref <130)
Total CHOL/HDL Ratio: 3.2 (calc) (ref <5.0)
Triglycerides: 178 mg/dL — ABNORMAL HIGH (ref <150)

## 2023-03-28 LAB — COMPREHENSIVE METABOLIC PANEL
AG Ratio: 1.6 (calc) (ref 1.0–2.5)
ALT: 7 U/L (ref 6–29)
AST: 15 U/L (ref 10–35)
Albumin: 4.8 g/dL (ref 3.6–5.1)
Alkaline phosphatase (APISO): 68 U/L (ref 37–153)
BUN: 13 mg/dL (ref 7–25)
CO2: 28 mmol/L (ref 20–32)
Calcium: 9.7 mg/dL (ref 8.6–10.4)
Chloride: 100 mmol/L (ref 98–110)
Creat: 0.76 mg/dL (ref 0.50–1.05)
Globulin: 3 g/dL (calc) (ref 1.9–3.7)
Glucose, Bld: 90 mg/dL (ref 65–99)
Potassium: 4.3 mmol/L (ref 3.5–5.3)
Sodium: 136 mmol/L (ref 135–146)
Total Bilirubin: 0.8 mg/dL (ref 0.2–1.2)
Total Protein: 7.8 g/dL (ref 6.1–8.1)

## 2023-03-28 LAB — HEPATITIS B SURFACE ANTIGEN: Hepatitis B Surface Ag: NONREACTIVE

## 2023-03-28 LAB — HIV ANTIBODY (ROUTINE TESTING W REFLEX): HIV 1&2 Ab, 4th Generation: NONREACTIVE

## 2023-03-28 LAB — CBC WITH DIFFERENTIAL/PLATELET
Absolute Monocytes: 437 cells/uL (ref 200–950)
Basophils Absolute: 70 cells/uL (ref 0–200)
Basophils Relative: 1.3 %
Eosinophils Relative: 4.6 %
MCH: 29.7 pg (ref 27.0–33.0)
MCV: 89 fL (ref 80.0–100.0)
MPV: 9.9 fL (ref 7.5–12.5)
Neutrophils Relative %: 47.1 %
Platelets: 291 10*3/uL (ref 140–400)
RBC: 4.55 10*6/uL (ref 3.80–5.10)
Total Lymphocyte: 38.9 %
WBC: 5.4 10*3/uL (ref 3.8–10.8)

## 2023-03-28 LAB — HEPATITIS C ANTIBODY: Hepatitis C Ab: NONREACTIVE

## 2023-04-06 ENCOUNTER — Telehealth: Payer: Self-pay

## 2023-04-06 NOTE — Telephone Encounter (Signed)
Called patient to schedule new patient appointment. Left voicemail with our contact information to call back and schedule.  

## 2023-04-24 ENCOUNTER — Telehealth (HOSPITAL_BASED_OUTPATIENT_CLINIC_OR_DEPARTMENT_OTHER): Payer: Self-pay

## 2023-05-04 ENCOUNTER — Telehealth: Payer: Self-pay

## 2023-05-04 NOTE — Telephone Encounter (Signed)
Called patient to schedule new patient appointment. Left voicemail with our contact information to call back and schedule.  

## 2023-10-13 ENCOUNTER — Other Ambulatory Visit: Payer: Self-pay | Admitting: Family

## 2023-10-13 DIAGNOSIS — I10 Essential (primary) hypertension: Secondary | ICD-10-CM

## 2023-10-27 ENCOUNTER — Other Ambulatory Visit: Payer: Self-pay | Admitting: Family

## 2023-10-27 DIAGNOSIS — I10 Essential (primary) hypertension: Secondary | ICD-10-CM

## 2023-10-30 ENCOUNTER — Other Ambulatory Visit: Payer: Self-pay | Admitting: Family

## 2023-10-30 DIAGNOSIS — I10 Essential (primary) hypertension: Secondary | ICD-10-CM

## 2023-10-30 MED ORDER — AMLODIPINE BESYLATE 5 MG PO TABS: 5.0000 mg | ORAL_TABLET | Freq: Every day | ORAL | 0 refills | Status: DC

## 2023-10-30 NOTE — Telephone Encounter (Signed)
Lvm 2 schedule.

## 2023-10-30 NOTE — Telephone Encounter (Signed)
I sent a refill for her blood pressure medication but she is overdue for follow up. Needs visit prior to additional refills.

## 2023-11-28 ENCOUNTER — Other Ambulatory Visit: Payer: Self-pay | Admitting: Family

## 2023-11-28 DIAGNOSIS — I10 Essential (primary) hypertension: Secondary | ICD-10-CM

## 2023-11-28 NOTE — Telephone Encounter (Signed)
 Please contact pt to schedule a follow up visit.

## 2023-11-29 ENCOUNTER — Other Ambulatory Visit: Payer: Self-pay | Admitting: Family

## 2023-11-29 DIAGNOSIS — I10 Essential (primary) hypertension: Secondary | ICD-10-CM

## 2023-12-01 NOTE — Telephone Encounter (Signed)
 Lvm 2 sch.

## 2023-12-29 ENCOUNTER — Other Ambulatory Visit: Payer: Self-pay | Admitting: Family

## 2023-12-29 DIAGNOSIS — I10 Essential (primary) hypertension: Secondary | ICD-10-CM

## 2023-12-29 MED ORDER — LISINOPRIL 5 MG PO TABS
ORAL_TABLET | ORAL | 0 refills | Status: DC
Start: 2023-12-29 — End: 2024-04-03

## 2023-12-30 ENCOUNTER — Other Ambulatory Visit: Payer: Self-pay | Admitting: Family

## 2023-12-30 DIAGNOSIS — I10 Essential (primary) hypertension: Secondary | ICD-10-CM

## 2024-01-03 ENCOUNTER — Other Ambulatory Visit: Payer: Self-pay | Admitting: Family

## 2024-01-03 DIAGNOSIS — I10 Essential (primary) hypertension: Secondary | ICD-10-CM

## 2024-04-02 ENCOUNTER — Other Ambulatory Visit: Payer: Self-pay | Admitting: Family

## 2024-04-02 DIAGNOSIS — I10 Essential (primary) hypertension: Secondary | ICD-10-CM

## 2024-04-03 NOTE — Telephone Encounter (Signed)
Please contact pt to schedule appointment.  

## 2024-04-08 ENCOUNTER — Other Ambulatory Visit: Payer: Self-pay | Admitting: Family

## 2024-04-08 DIAGNOSIS — I10 Essential (primary) hypertension: Secondary | ICD-10-CM

## 2024-04-20 ENCOUNTER — Ambulatory Visit: Admitting: Family

## 2024-05-03 ENCOUNTER — Ambulatory Visit: Admitting: Family

## 2024-05-06 ENCOUNTER — Ambulatory Visit (INDEPENDENT_AMBULATORY_CARE_PROVIDER_SITE_OTHER): Admitting: Family

## 2024-05-06 VITALS — BP 157/63 | HR 61 | Temp 97.8°F | Resp 16 | Ht 63.0 in | Wt 115.0 lb

## 2024-05-06 DIAGNOSIS — Z0000 Encounter for general adult medical examination without abnormal findings: Secondary | ICD-10-CM | POA: Diagnosis not present

## 2024-05-06 DIAGNOSIS — I10 Essential (primary) hypertension: Secondary | ICD-10-CM | POA: Diagnosis not present

## 2024-05-06 MED ORDER — VALSARTAN 160 MG PO TABS
160.0000 mg | ORAL_TABLET | Freq: Every day | ORAL | 0 refills | Status: DC
Start: 2024-05-06 — End: 2024-07-01

## 2024-05-06 NOTE — Progress Notes (Signed)
 Subjective:     Patient ID: Misty Abbott, female    DOB: 02/04/1961, 63 y.o.   MRN: [id number]  Chief Complaint  Patient presents with   Hypertension    Here for follow up    HPI  Discussed the use of AI scribe software for clinical note transcription with the patient, who gave verbal consent to proceed.  History of Present Illness  Misty Abbott is a 63 year old female with hypertension who presents for follow-up on blood pressure management.  She is currently taking lisinopril  and has stopped amlodipine  due to peripheral edema. Her home blood pressure readings are frequently above 137/90 mmHg, even with the lowest recorded being at this level. She uses both a wrist and a traditional cuff monitor at home.  She is under significant stress due to recent family losses, which she feels may be impacting her blood pressure. Her husband is retired and recently had an ablation for atrial fibrillation, adding to her stress. She perceives fluctuations in her blood pressure related to stress and emotions.  She experiences anxiety when checking her blood pressure at home and prefers to check it in a neutral setting, such as a pharmacy, to reduce anxiety.  She has high cholesterol, which she believes is hereditary, as her father had similar issues.      Health Maintenance Due  Topic Date Due   Zoster Vaccines- Shingrix (1 of 2) Never done   COVID-19 Vaccine (1 - 2024-25 season) Never done    Past Medical History:  Diagnosis Date   Anxiety    Hypertension    Perforated tympanic membrane     Past Surgical History:  Procedure Laterality Date   TYMPANOPLASTY  2018   Sees Dr. Alm Fell ENT    Family History  Problem Relation Age of Onset   Alzheimer's disease Mother        died at age 36   Stroke Father    Hypertension Father    Diabetes Mellitus II Nephew    Diabetes Mellitus I Nephew     Social History   Socioeconomic History   Marital status: Married    Spouse  name: Not on file   Number of children: Not on file   Years of education: Not on file   Highest education level: Not on file  Occupational History   Not on file  Tobacco Use   Smoking status: Never   Smokeless tobacco: Never  Substance and Sexual Activity   Alcohol use: Yes    Alcohol/week: 1.0 - 2.0 standard drink of alcohol    Types: 1 - 2 Standard drinks or equivalent per week   Drug use: No   Sexual activity: Not Currently    Partners: Male    Birth control/protection: Post-menopausal  Other Topics Concern   Not on file  Social History Narrative   Works in Education officer, environmental, Air cabin crew   No children,    Enjoys walks, dog, reading, outdoor/activities   Social Drivers of Corporate investment banker Strain: Not on file  Food Insecurity: Not on file  Transportation Needs: Not on file  Physical Activity: Not on file  Stress: Not on file  Social Connections: Not on file  Intimate Partner Violence: Low Risk  (02/07/2021)   Received from [facility]   Intimate Partner Violence    Insults You: Not on file    Threatens You: Not on file    Screams at You: Not on file    Physically Hurt:  Not on file    Intimate Partner Violence Score: Not on file    Outpatient Medications Prior to Visit  Medication Sig Dispense Refill   betamethasone  dipropionate 0.05 % cream Apply topically 2 (two) times daily as needed. 30 g 1   cholecalciferol (VITAMIN D ) 1000 units tablet Take 1,000 Units by mouth daily.     Cyanocobalamin (VITAMIN B 12 PO) Take 1,000 mcg by mouth.     Omega 3 1200 MG CAPS Take by mouth.     vitamin C (ASCORBIC ACID) 500 MG tablet Take 500 mg by mouth daily.     lisinopril  (ZESTRIL ) 5 MG tablet TAKE 1 TABLET(5 MG) BY MOUTH DAILY 30 tablet 0   amLODipine  (NORVASC ) 5 MG tablet Take 1 tablet (5 mg total) by mouth daily. (Patient not taking: Reported on 05/06/2024) 30 tablet 0   No facility-administered medications prior to visit.    No Known Allergies  ROS See  HPI    Objective:    Physical Exam Constitutional:      General: She is not in acute distress.    Appearance: Normal appearance. She is well-developed.  HENT:     Head: Normocephalic and atraumatic.     Right Ear: External ear normal.     Left Ear: External ear normal.   Eyes:     General: No scleral icterus.  Neck:     Thyroid: No thyromegaly.   Cardiovascular:     Rate and Rhythm: Normal rate and regular rhythm.     Heart sounds: Normal heart sounds. No murmur heard. Pulmonary:     Effort: Pulmonary effort is normal. No respiratory distress.     Breath sounds: Normal breath sounds. No wheezing.   Musculoskeletal:     Cervical back: Neck supple.   Skin:    General: Skin is warm and dry.   Neurological:     Mental Status: She is alert and oriented to person, place, and time.   Psychiatric:        Mood and Affect: Mood normal.        Behavior: Behavior normal.        Thought Content: Thought content normal.        Judgment: Judgment normal.      BP (!) 157/63 (BP Location: Right Arm, Patient Position: Sitting, Cuff Size: Small)   Pulse 61   Temp 97.8 F (36.6 C) (Oral)   Resp 16   Ht 5' 3 (1.6 m)   Wt 115 lb (52.2 kg)   SpO2 100%   BMI 20.37 kg/m  Wt Readings from Last 3 Encounters:  05/06/24 115 lb (52.2 kg)  03/27/23 115 lb (52.2 kg)  02/04/22 108 lb (49 kg)       Assessment & Plan:   Problem List Items Addressed This Visit       Unprioritized   Preventative health care    She is reluctant to undergo routine screenings. Discussed importance of early detection in improving outcomes and survival rates. Family history of hereditary hyperlipidemia noted. Emphasized screenings can detect issues before symptoms arise, improving prognosis. - Encourage consideration of routine screenings such as mammograms and colonoscopies.      Hypertension - Primary   BP Readings from Last 3 Encounters:  05/06/24 (!) 157/63  03/27/23 (!) 170/78  02/04/22 (!)  155/70    Chronic hypertension with systolic readings consistently above 155 mmHg. Peripheral edema with amlodipine  led to discontinuation. Stress and emotional factors likely contribute. Discussed switching from lisinopril   to valsartan  to manage blood pressure. - Discontinue lisinopril . - Prescribe valsartan . - Recheck blood pressure in two weeks. - Check renal function in the lab.       Relevant Medications   valsartan  (DIOVAN ) 160 MG tablet   Other Relevant Orders   Basic Metabolic Panel (BMET) (Completed)   >30 minutes spent on today's visit. The majority of this time was spent counseling patient on importance of medication compliance and preventative screenings.  I have discontinued Tinaya Drab's lisinopril  and amLODipine . I am also having her start on valsartan . Additionally, I am having her maintain her Omega 3, cholecalciferol, ascorbic acid, Cyanocobalamin (VITAMIN B 12 PO), and betamethasone  dipropionate.  Meds ordered this encounter  Medications   valsartan  (DIOVAN ) 160 MG tablet    Sig: Take 1 tablet (160 mg total) by mouth daily.    Dispense:  30 tablet    Refill:  0    Supervising Provider:   DOMENICA BLACKBIRD A [[id number]]

## 2024-05-06 NOTE — Assessment & Plan Note (Addendum)
 BP Readings from Last 3 Encounters:  05/06/24 (!) 157/63  03/27/23 (!) 170/78  02/04/22 (!) 155/70    Chronic hypertension with systolic readings consistently above 155 mmHg. Peripheral edema with amlodipine  led to discontinuation. Stress and emotional factors likely contribute. Discussed switching from lisinopril  to valsartan  to manage blood pressure. - Discontinue lisinopril . - Prescribe valsartan . - Recheck blood pressure in two weeks. - Check renal function in the lab.

## 2024-05-07 ENCOUNTER — Ambulatory Visit: Payer: Self-pay | Admitting: Family

## 2024-05-07 LAB — BASIC METABOLIC PANEL WITH GFR
BUN: 20 mg/dL (ref 7–25)
CO2: 29 mmol/L (ref 20–32)
Calcium: 9.8 mg/dL (ref 8.6–10.4)
Chloride: 101 mmol/L (ref 98–110)
Creat: 1.02 mg/dL (ref 0.50–1.05)
Glucose, Bld: 99 mg/dL (ref 65–99)
Potassium: 5 mmol/L (ref 3.5–5.3)
Sodium: 139 mmol/L (ref 135–146)
eGFR: 62 mL/min/{1.73_m2} (ref >=60)

## 2024-05-07 NOTE — Assessment & Plan Note (Signed)
  She is reluctant to undergo routine screenings. Discussed importance of early detection in improving outcomes and survival rates. Family history of hereditary hyperlipidemia noted. Emphasized screenings can detect issues before symptoms arise, improving prognosis. - Encourage consideration of routine screenings such as mammograms and colonoscopies.

## 2024-05-07 NOTE — Patient Instructions (Signed)
 VISIT SUMMARY:  Today, we discussed your blood pressure management and the impact of stress on your health. We also talked about the importance of routine health screenings.  YOUR PLAN:  HYPERTENSION: Your blood pressure readings are consistently high, and stress may be contributing to this. -Stop taking lisinopril . -Start taking valsartan  at a medium dose. -Check your blood pressure again in two weeks. -Get your kidney function tested in the lab.  GENERAL HEALTH MAINTENANCE: We discussed the importance of routine health screenings for early detection of potential health issues. -Consider scheduling routine screenings such as mammograms, Pap smear and and colonoscopies.

## 2024-05-25 ENCOUNTER — Ambulatory Visit: Admitting: Family

## 2024-07-01 ENCOUNTER — Ambulatory Visit (INDEPENDENT_AMBULATORY_CARE_PROVIDER_SITE_OTHER): Admitting: Family

## 2024-07-01 DIAGNOSIS — I10 Essential (primary) hypertension: Secondary | ICD-10-CM | POA: Diagnosis not present

## 2024-07-01 MED ORDER — VALSARTAN 160 MG PO TABS: 160.0000 mg | ORAL_TABLET | Freq: Every day | ORAL | 0 refills | Status: AC

## 2024-07-01 NOTE — Progress Notes (Signed)
 Subjective:     Patient ID: Misty Abbott, female    DOB: 12-Oct-1961, 63 y.o.   MRN: [id number]  Chief Complaint  Patient presents with   Hypertension    Here for follow up    Hypertension    Discussed the use of AI scribe software for clinical note transcription with the patient, who gave verbal consent to proceed.  History of Present Illness  Misty Abbott is a 63 year old female with hypertension who presents for blood pressure management.  She was previously on lisinopril , which was discontinued due to high blood pressure readings. Valsartan  was started but she ran out of the medication after a month and has not been on any antihypertensive medication since. She manages her blood pressure through lifestyle modifications, including morning yoga, a diet rich in fish, vegetables, fruits, and oatmeal, and consuming beet juice every other day.  Her blood pressure at home was 145/87 mmHg this morning, which she attributes to poor sleep, having only slept three to four hours. She uses two blood pressure monitors at home, preferring the arm cuff over the wrist monitor for accuracy.   BP Readings from Last 3 Encounters:  07/01/24 124/62  05/06/24 (!) 157/63  03/27/23 (!) 170/78         Health Maintenance Due  Topic Date Due   Pneumococcal Vaccine: 50+ Years (1 of 1 - PCV) Never done   Zoster Vaccines- Shingrix (1 of 2) Never done   COVID-19 Vaccine (1 - 2024-25 season) Never done   INFLUENZA VACCINE  06/17/2024    Past Medical History:  Diagnosis Date   Anxiety    Hypertension    Perforated tympanic membrane     Past Surgical History:  Procedure Laterality Date   TYMPANOPLASTY  2018   Sees Dr. Alm Fell ENT    Family History  Problem Relation Age of Onset   Alzheimer's disease Mother        died at age 68   Stroke Father    Hypertension Father    Diabetes Mellitus II Nephew    Diabetes Mellitus I Nephew     Social History   Socioeconomic History    Marital status: Married    Spouse name: Not on file   Number of children: Not on file   Years of education: Not on file   Highest education level: Not on file  Occupational History   Not on file  Tobacco Use   Smoking status: Never   Smokeless tobacco: Never  Substance and Sexual Activity   Alcohol use: Yes    Alcohol/week: 1.0 - 2.0 standard drink of alcohol    Types: 1 - 2 Standard drinks or equivalent per week   Drug use: No   Sexual activity: Not Currently    Partners: Male    Birth control/protection: Post-menopausal  Other Topics Concern   Not on file  Social History Narrative   Works in Education officer, environmental, Air cabin crew   No children,    Enjoys walks, dog, reading, outdoor/activities   Social Drivers of Corporate investment banker Strain: Not on file  Food Insecurity: Not on file  Transportation Needs: Not on file  Physical Activity: Not on file  Stress: Not on file  Social Connections: Not on file  Intimate Partner Violence: Low Risk  (02/07/2021)   Received from [facility]   Intimate Partner Violence    Insults You: Not on file    Threatens You: Not on file  Screams at You: Not on file    Physically Hurt: Not on file    Intimate Partner Violence Score: Not on file    Outpatient Medications Prior to Visit  Medication Sig Dispense Refill   betamethasone  dipropionate 0.05 % cream Apply topically 2 (two) times daily as needed. 30 g 1   cholecalciferol (VITAMIN D ) 1000 units tablet Take 1,000 Units by mouth daily.     Cyanocobalamin (VITAMIN B 12 PO) Take 1,000 mcg by mouth.     Omega 3 1200 MG CAPS Take by mouth.     vitamin C (ASCORBIC ACID) 500 MG tablet Take 500 mg by mouth daily.     valsartan  (DIOVAN ) 160 MG tablet Take 1 tablet (160 mg total) by mouth daily. 30 tablet 0   No facility-administered medications prior to visit.    No Known Allergies  ROS See HPI    Objective:    Physical Exam Constitutional:      General: She is not in acute  distress.    Appearance: Normal appearance. She is well-developed.  HENT:     Head: Normocephalic and atraumatic.     Right Ear: External ear normal.     Left Ear: External ear normal.  Eyes:     General: No scleral icterus. Neck:     Thyroid: No thyromegaly.  Cardiovascular:     Rate and Rhythm: Normal rate and regular rhythm.     Heart sounds: Normal heart sounds. No murmur heard. Pulmonary:     Effort: Pulmonary effort is normal. No respiratory distress.     Breath sounds: Normal breath sounds. No wheezing.  Musculoskeletal:     Cervical back: Neck supple.  Skin:    General: Skin is warm and dry.  Neurological:     Mental Status: She is alert and oriented to person, place, and time.  Psychiatric:        Mood and Affect: Mood normal.        Behavior: Behavior normal.        Thought Content: Thought content normal.        Judgment: Judgment normal.      BP 124/62 (BP Location: Right Arm, Patient Position: Sitting, Cuff Size: Small)   Pulse 99   Temp 99.1 F (37.3 C) (Oral)   Resp 16   Ht 1' (0.305 m)   Wt 114 lb (51.7 kg)   SpO2 100%   BMI 556.60 kg/m  Wt Readings from Last 3 Encounters:  07/01/24 114 lb (51.7 kg)  05/06/24 115 lb (52.2 kg)  03/27/23 115 lb (52.2 kg)       Assessment & Plan:   Problem List Items Addressed This Visit       Unprioritized   Hypertension   BP looks ok today without medication.  Blood pressure in office today is 124/62.  - Monitor blood pressure at home regularly. - Start valsartan  if blood pressure consistently >140/90 mmHg. - Send valsartan  prescription to pharmacy. - Repeat lab work in two weeks if valsartan  is restarted. - Recheck blood pressure in clinic in three months.      Relevant Medications   valsartan  (DIOVAN ) 160 MG tablet   Declines prevnar 20 and Shingrix today.  I am having Misty Abbott maintain her Omega 3, cholecalciferol, ascorbic acid, Cyanocobalamin (VITAMIN B 12 PO), betamethasone  dipropionate,  and valsartan .  Meds ordered this encounter  Medications   valsartan  (DIOVAN ) 160 MG tablet    Sig: Take 1 tablet (160 mg total) by  mouth daily.    Dispense:  90 tablet    Refill:  0    Supervising Provider:   DOMENICA BLACKBIRD A [[id number]]

## 2024-07-01 NOTE — Patient Instructions (Signed)
 VISIT SUMMARY:  Today, we discussed managing your blood pressure and reviewed your current lifestyle modifications. We also talked about the possibility of starting valsartan again and considered future vaccinations for shingles and pneumonia.  YOUR PLAN:  HYPERTENSION: Your blood pressure was slightly elevated at 145/87 mmHg. Our goal is to keep it below 140/90 mmHg. -Monitor your blood pressure at home regularly. -Start taking valsartan if your blood pressure is consistently above 140/90 mmHg. -I will send a prescription for valsartan to your pharmacy. -If you restart valsartan, we will repeat lab work in two weeks. -We will recheck your blood pressure in the clinic in three months.  GENERAL HEALTH MAINTENANCE: We discussed vaccinations for shingles and pneumonia, which you are considering for the future. -Consider getting shingles and pneumonia vaccines at a future visit.

## 2024-07-01 NOTE — Assessment & Plan Note (Signed)
 BP looks ok today without medication.  Blood pressure in office today is 124/62.  - Monitor blood pressure at home regularly. - Start valsartan  if blood pressure consistently >140/90 mmHg. - Send valsartan  prescription to pharmacy. - Repeat lab work in two weeks if valsartan  is restarted. - Recheck blood pressure in clinic in three months.
